# Patient Record
Sex: Male | Born: 1960 | Hispanic: Yes | Marital: Married | Smoking: Never smoker
Health system: Southern US, Community
[De-identification: ages and names within clinical notes are randomized; demographics above are authoritative.]

## PROBLEM LIST (undated history)

## (undated) DIAGNOSIS — K269 Duodenal ulcer, unspecified as acute or chronic, without hemorrhage or perforation: Secondary | ICD-10-CM

## (undated) DIAGNOSIS — E119 Type 2 diabetes mellitus without complications: Secondary | ICD-10-CM

## (undated) DIAGNOSIS — C259 Malignant neoplasm of pancreas, unspecified: Secondary | ICD-10-CM

---

## 2020-04-20 NOTE — Progress Notes (Signed)
Spoke with patient's wife Herb Grays regarding referral for patient to be seen by Dr. Benay Spice.  I gave her the appointment of next Tuesday 04/26/20 at 2 pm.  I asked that they arrive at least 20 minutes prior for registration purposes. She verbalized an understanding.

## 2020-04-26 ENCOUNTER — Inpatient Hospital Stay: Payer: Medicaid - Out of State | Attending: Oncology | Admitting: Oncology

## 2020-04-26 ENCOUNTER — Other Ambulatory Visit: Payer: Self-pay

## 2020-04-26 VITALS — BP 116/89 | HR 87 | Temp 98.3°F | Resp 17 | Ht 70.0 in | Wt 150.4 lb

## 2020-04-26 DIAGNOSIS — C259 Malignant neoplasm of pancreas, unspecified: Secondary | ICD-10-CM

## 2020-04-26 DIAGNOSIS — C251 Malignant neoplasm of body of pancreas: Secondary | ICD-10-CM | POA: Diagnosis not present

## 2020-04-26 NOTE — Progress Notes (Signed)
Called [facility] place referral and was instructed to enter referral into Epic. This was done as requested. Dr. Benay Spice will be attending and patient has agreed to DNR. Will need help with wound care to sacral area and pain medications. Patient is medicaid pending in [place]. Just moved here from [place].

## 2020-04-26 NOTE — Progress Notes (Signed)
Jeremy Horton Patient Consult   Requesting MD: Olin Gurski 60 y.o.  01/20/1961    Reason for Consult: Pancreas cancer   HPI:  Mr.Ostrom was diagnosed with cancer of the pancreas body in May 2020 after he presented with weight loss and abdominal pain.  He was diagnosed and treated in [place] and recently relocated to [place].  The treatment history is obtained from a summary note provided by Dr. Berneice Gandy.  An MRI 07/10/2018 revealed a pancreas body mass with less than 180 of celiac artery involvement with an EUS biopsy confirming a moderately differentiated pancreas carcinoma with focal squamous differentiation, MSS.  He was treated with FOLFIRINOX for 8 cycles.  An MRI on 11/03/2018 revealed tumor progression with encasement of the celiac artery.  Cycle 8 FOLFIRINOX was completed on 11/11/2018.  He started gemcitabine/Abraxane in September 2020 and a repeat CT on 01/28/2019 revealed progression of the pancreas mass with encasement of the celiac artery, less than 180 SMA involvement with a 1.6 cm superior peripancreatic node.  He completed cycle 4 gemcitabine/Abraxane 02/10/2019. He was treated with SBRT in 5 fraction (50 Pearline Cables) in December 2020. He continued gemcitabine/Abraxane with stable to slightly progressive disease.  The CA 19-9 slowly increased in 2021.  CTs 07/29/2019 revealed stable disease.  A CT in October 21 revealed progressive disease and treatment was discontinued.  He is remained off of systemic therapy since October 21.  His wife reports he was admitted in December 21 with with a bleeding gastric ulcer.  He was treated with an endoscopic intervention and antacid therapy.  He saw Dr. Berneice Gandy on 04/12/2020.  Potential salvage systemic therapy options were discussed including 5-FU plus liposomal irinotecan, capecitabine and nilotinib, single agent erlotinib, GTX, and single agent capecitabine.  Hospice care was recommended secondary to his poor  performance status.  Past medical history: 1.  Locally advanced pancreas cancer diagnosed in May 2020 2.  Bleeding duodenal ulcer December 21 3.  Diabetes 2015  Past surgical history: None   Medications: Reviewed  Allergies: Not on File  Family history: No family history of cancer there is a strong family history of diabetes  Social History:   He lives with his wife in [place].  He has worked as a Scientist, forensic.  He is currently disabled.  He does not use cigarettes or alcohol.  No transfusion history.  Jehovah witness no risk factor for HIV or hepatitis.  ROS:   Positives include: Anorexia, 100 pound weight loss since being diagnosed with pancreas cancer, intermittent nausea, abdominal pain, somnolence with a markedly decreased appetite when he was placed on methadone while in [place], sacral ulcer, sleeps during the day and is awake at night, spends most of his time in bed or on the couch, requires assistance for bathing  A complete ROS was otherwise negative.  Physical Exam:  Blood pressure 116/89, pulse 87, temperature 98.3 F (36.8 C), temperature source Tympanic, resp. rate 17, height 5\' 10"  (1.778 m), weight 150 lb 6.4 oz (68.2 kg), SpO2 98 %.  HEENT: Neck without mass Lungs: Clear bilaterally Cardiac: Regular rate and rhythm Abdomen: No mass, no hepatosplenomegaly, no apparent ascites, nontender  Vascular: No leg edema Lymph nodes: No cervical, supraclavicular, axillary, or inguinal nodes Neurologic: Somnolent, arousable, follows commands, moves all extremities, oriented Skin: No rash, 2-3 cm ulcer at the upper left gluteal fold, 1-2 cm ulcer at the right upper gluteal fold Musculoskeletal: No spine tenderness   LAB:  CBC  No results found for: WBC, HGB, HCT, MCV, PLT, NEUTROABS      CMP  No results found for: NA, K, CL, CO2, GLUCOSE, BUN, CREATININE, CALCIUM, PROT, ALBUMIN, AST, ALT, ALKPHOS, BILITOT, GFRNONAA, GFRAA   No results found for:  CEA1  Imaging:  No results found.    Assessment/Plan:   1. Pancreas cancer diagnosed in May 2020  8 cycles of FOLFIRINOX beginning 08/05/2018  MRI abdomen 11/03/2018-tumor progression with mass encasing celiac artery  11/23/2018-started gemcitabine/Abraxane  01/28/2019-CT with progressive disease, 5.6 and meter mass encasing celiac artery, less than 180 SMA involvement, 1.6 cm superior peripancreatic node  Completed 4 cycles of gem/Abraxane 02/10/2019  SBRT, 5 fractions (50 Gy) completed 04/17/2019  Gemcitabine/Abraxane continued with stable to slightly progressive disease  07/29/2019 CT-stable disease with mass encasing the celiac axis  October 2021-CTs with progressive disease, treatment discontinued  2. Admission December 2021 with a bleeding duodenal ulcer treated with bipolar cautery 3. Anorexia/weight loss secondary to #1 4. Pain secondary to #1 5. Sacral decubitus ulcers 6. Jehovah's Witness   Disposition:   Mr Madole has a history of locally advanced pancreas cancer diagnosed in May 2020.  He has been maintained off of systemic therapy since October 2021 secondary to disease progression.  He has a poor performance status, ECOG 2-3.  He is symptomatic with anorexia and pain.  I discussed the poor prognosis with Mr. Bradt and his wife.  He is not a candidate for salvage systemic therapy secondary to his poor performance status.  I recommend hospice care.  He is in agreement.  We discussed CPR and ACLS.  He will be placed on a no CODE BLUE status.  He will decrease the gabapentin to twice daily.  He is unclear whether this is helping the pain and could be contributing to somnolence.  He will continue MS Contin and oxycodone for pain.  Mr. Lisbon will return for an office visit and further discussion in approximately 3 weeks.  He has sacral decubitus ulcers.  We recommended he decrease weightbearing on the sacrum.  We will request a skin care consult from the hospice  team.    Betsy Coder, MD  04/26/2020, 4:41 PM

## 2020-04-27 ENCOUNTER — Telehealth: Payer: Self-pay | Admitting: Oncology

## 2020-04-27 NOTE — Telephone Encounter (Signed)
Scheduled appointment per 2/22 los. Spoke to patient's wife who is aware of appointment date and time.

## 2020-04-28 ENCOUNTER — Observation Stay (HOSPITAL_COMMUNITY)
Admission: EM | Admit: 2020-04-28 | Discharge: 2020-04-29 | Disposition: A | Discharge status: Home / Self Care | Payer: Medicaid - Out of State | Attending: Emergency Medicine | Admitting: Emergency Medicine

## 2020-04-28 ENCOUNTER — Emergency Department (HOSPITAL_COMMUNITY): Payer: Medicaid - Out of State

## 2020-04-28 ENCOUNTER — Other Ambulatory Visit: Payer: Self-pay

## 2020-04-28 ENCOUNTER — Telehealth: Payer: Self-pay | Admitting: *Deleted

## 2020-04-28 ENCOUNTER — Observation Stay (HOSPITAL_COMMUNITY): Payer: Medicaid - Out of State

## 2020-04-28 ENCOUNTER — Encounter (HOSPITAL_COMMUNITY): Payer: Self-pay | Admitting: Emergency Medicine

## 2020-04-28 DIAGNOSIS — Z8507 Personal history of malignant neoplasm of pancreas: Secondary | ICD-10-CM | POA: Diagnosis not present

## 2020-04-28 DIAGNOSIS — Z20822 Contact with and (suspected) exposure to covid-19: Secondary | ICD-10-CM | POA: Diagnosis not present

## 2020-04-28 DIAGNOSIS — E119 Type 2 diabetes mellitus without complications: Secondary | ICD-10-CM | POA: Insufficient documentation

## 2020-04-28 DIAGNOSIS — R4182 Altered mental status, unspecified: Secondary | ICD-10-CM | POA: Diagnosis present

## 2020-04-28 DIAGNOSIS — Z79899 Other long term (current) drug therapy: Secondary | ICD-10-CM | POA: Diagnosis not present

## 2020-04-28 DIAGNOSIS — G9341 Metabolic encephalopathy: Principal | ICD-10-CM | POA: Insufficient documentation

## 2020-04-28 DIAGNOSIS — C251 Malignant neoplasm of body of pancreas: Secondary | ICD-10-CM | POA: Diagnosis not present

## 2020-04-28 HISTORY — DX: Malignant neoplasm of pancreas, unspecified: C25.9

## 2020-04-28 HISTORY — DX: Type 2 diabetes mellitus without complications: E11.9

## 2020-04-28 HISTORY — DX: Duodenal ulcer, unspecified as acute or chronic, without hemorrhage or perforation: K26.9

## 2020-04-28 LAB — COMPREHENSIVE METABOLIC PANEL
ALT: 27 U/L (ref 0–44)
AST: 24 U/L (ref 15–41)
Albumin: 3 g/dL — ABNORMAL LOW (ref 3.5–5.0)
Alkaline Phosphatase: 101 U/L (ref 38–126)
Anion gap: 10 (ref 5–15)
BUN: 14 mg/dL (ref 6–20)
CO2: 27 mmol/L (ref 22–32)
Calcium: 9.8 mg/dL (ref 8.9–10.3)
Chloride: 96 mmol/L — ABNORMAL LOW (ref 98–111)
Creatinine, Ser: 0.66 mg/dL (ref 0.61–1.24)
GFR, Estimated: >60 mL/min (ref >60)
Glucose, Bld: 409 mg/dL — ABNORMAL HIGH (ref 70–99)
Potassium: 4 mmol/L (ref 3.5–5.1)
Sodium: 133 mmol/L — ABNORMAL LOW (ref 135–145)
Total Bilirubin: 0.8 mg/dL (ref 0.3–1.2)
Total Protein: 7.1 g/dL (ref 6.5–8.1)

## 2020-04-28 LAB — CBC WITH DIFFERENTIAL/PLATELET
Abs Immature Granulocytes: 0.02 10*3/uL (ref 0.00–0.07)
Basophils Absolute: 0.1 10*3/uL (ref 0.0–0.1)
Basophils Relative: 1 %
Eosinophils Absolute: 0.1 10*3/uL (ref 0.0–0.5)
Eosinophils Relative: 2 %
HCT: 33.7 % — ABNORMAL LOW (ref 39.0–52.0)
Hemoglobin: 10.8 g/dL — ABNORMAL LOW (ref 13.0–17.0)
Immature Granulocytes: 0 %
Lymphocytes Relative: 15 %
Lymphs Abs: 1.1 10*3/uL (ref 0.7–4.0)
MCH: 28 pg (ref 26.0–34.0)
MCHC: 32 g/dL (ref 30.0–36.0)
MCV: 87.3 fL (ref 80.0–100.0)
Monocytes Absolute: 0.6 10*3/uL (ref 0.1–1.0)
Monocytes Relative: 8 %
Neutro Abs: 5.2 10*3/uL (ref 1.7–7.7)
Neutrophils Relative %: 74 %
Platelets: 226 10*3/uL (ref 150–400)
RBC: 3.86 MIL/uL — ABNORMAL LOW (ref 4.22–5.81)
RDW: 14.6 % (ref 11.5–15.5)
WBC: 7.1 10*3/uL (ref 4.0–10.5)
nRBC: 0 % (ref 0.0–0.2)

## 2020-04-28 LAB — LACTIC ACID, PLASMA: Lactic Acid, Venous: 1.4 mmol/L (ref 0.5–1.9)

## 2020-04-28 LAB — URINALYSIS, COMPLETE (UACMP) WITH MICROSCOPIC
Bacteria, UA: NONE SEEN
Bilirubin Urine: NEGATIVE
Glucose, UA: >=500 mg/dL — AB
Hgb urine dipstick: NEGATIVE
Ketones, ur: 20 mg/dL — AB
Leukocytes,Ua: NEGATIVE
Nitrite: NEGATIVE
Protein, ur: NEGATIVE mg/dL
Specific Gravity, Urine: 1.031 — ABNORMAL HIGH (ref 1.005–1.030)
pH: 7 (ref 5.0–8.0)

## 2020-04-28 LAB — CBG MONITORING, ED: Glucose-Capillary: 426 mg/dL — ABNORMAL HIGH (ref 70–99)

## 2020-04-28 LAB — RESP PANEL BY RT-PCR (FLU A&B, COVID) ARPGX2
Influenza A by PCR: NEGATIVE
Influenza B by PCR: NEGATIVE
SARS Coronavirus 2 by RT PCR: NEGATIVE

## 2020-04-28 LAB — AMMONIA: Ammonia: 54 umol/L — ABNORMAL HIGH (ref 9–35)

## 2020-04-28 LAB — GLUCOSE, CAPILLARY
Glucose-Capillary: 323 mg/dL — ABNORMAL HIGH (ref 70–99)
Glucose-Capillary: 337 mg/dL — ABNORMAL HIGH (ref 70–99)

## 2020-04-28 MED ORDER — LORAZEPAM 2 MG/ML IJ SOLN
1.0000 mg | Freq: Once | INTRAMUSCULAR | Status: DC
  Filled 2020-04-28 (×2): qty 1

## 2020-04-28 MED ORDER — SODIUM CHLORIDE 0.9 % IV SOLN: INTRAVENOUS | Status: DC

## 2020-04-28 MED ORDER — HYDROMORPHONE HCL 1 MG/ML IJ SOLN
0.5000 mg | INTRAMUSCULAR | Status: DC | PRN
Start: 2020-04-28 — End: 2020-04-30
  Administered 2020-04-28: 1 mg via INTRAVENOUS
  Filled 2020-04-28: qty 1

## 2020-04-28 MED ORDER — OXYCODONE HCL 5 MG PO TABS
20.0000 mg | ORAL_TABLET | ORAL | Status: DC | PRN
  Administered 2020-04-28 – 2020-04-29 (×5): 20 mg via ORAL
  Filled 2020-04-28 (×6): qty 4

## 2020-04-28 MED ORDER — ONDANSETRON HCL 4 MG/2ML IJ SOLN: 4.0000 mg | Freq: Four times a day (QID) | INTRAMUSCULAR | Status: DC | PRN

## 2020-04-28 MED ORDER — THIAMINE HCL 100 MG/ML IJ SOLN
100.0000 mg | Freq: Once | INTRAMUSCULAR | Status: AC
  Administered 2020-04-28: 100 mg via INTRAVENOUS
  Filled 2020-04-28: qty 2

## 2020-04-28 MED ORDER — INSULIN ASPART 100 UNIT/ML ~~LOC~~ SOLN
0.0000 [IU] | Freq: Three times a day (TID) | SUBCUTANEOUS | Status: DC
  Administered 2020-04-28 – 2020-04-29 (×2): 7 [IU] via SUBCUTANEOUS
  Administered 2020-04-29: 5 [IU] via SUBCUTANEOUS
  Administered 2020-04-29: 3 [IU] via SUBCUTANEOUS

## 2020-04-28 MED ORDER — ENOXAPARIN SODIUM 40 MG/0.4ML ~~LOC~~ SOLN
40.0000 mg | SUBCUTANEOUS | Status: DC
  Administered 2020-04-28: 40 mg via SUBCUTANEOUS
  Filled 2020-04-28: qty 0.4

## 2020-04-28 MED ORDER — PANTOPRAZOLE SODIUM 40 MG PO TBEC
40.0000 mg | DELAYED_RELEASE_TABLET | Freq: Two times a day (BID) | ORAL | Status: DC
  Administered 2020-04-28 – 2020-04-29 (×2): 40 mg via ORAL
  Filled 2020-04-28 (×2): qty 1

## 2020-04-28 MED ORDER — ALBUTEROL SULFATE (2.5 MG/3ML) 0.083% IN NEBU: 2.5000 mg | INHALATION_SOLUTION | RESPIRATORY_TRACT | Status: DC | PRN

## 2020-04-28 MED ORDER — ONDANSETRON HCL 4 MG PO TABS: 4.0000 mg | ORAL_TABLET | Freq: Four times a day (QID) | ORAL | Status: DC | PRN

## 2020-04-28 MED ORDER — DOCUSATE SODIUM 100 MG PO CAPS
200.0000 mg | ORAL_CAPSULE | Freq: Two times a day (BID) | ORAL | Status: DC
  Administered 2020-04-28 – 2020-04-29 (×2): 200 mg via ORAL
  Filled 2020-04-28 (×2): qty 2

## 2020-04-28 MED ORDER — INSULIN ASPART 100 UNIT/ML ~~LOC~~ SOLN
0.0000 [IU] | Freq: Every day | SUBCUTANEOUS | Status: DC
  Administered 2020-04-28: 4 [IU] via SUBCUTANEOUS

## 2020-04-28 MED ORDER — TRAZODONE HCL 50 MG PO TABS
25.0000 mg | ORAL_TABLET | Freq: Every evening | ORAL | Status: DC | PRN
  Administered 2020-04-28: 25 mg via ORAL
  Filled 2020-04-28: qty 1

## 2020-04-28 NOTE — Telephone Encounter (Signed)
Wife left VM asking to speak with RN about her husband's status. They are considering taking him to the emergency room. Called wife back at 1600: Noted he is in the ER--CT head and CXR normal. Ammonia level returned at 54 and glucose elevated at 426. Reports he has been confused. She has been giving him Boost to drink since he has not been eating. Suggested she try Glucerna instead. Will make MD aware he is in the ER and will f/u tomorrow.

## 2020-04-28 NOTE — ED Triage Notes (Signed)
BIB wife, states confusion and AMS x2 days, patient is delayed in answering questions. Denies urinary or pain changes, or recent falls. Cancer doctor recommended hospice care. Also endorses decreased appetite.

## 2020-04-28 NOTE — H&P (Signed)
History and Physical  [name] ENI:[id number] DOB: 1961-02-03 DOA: 04/28/2020   PCP: Ladell Pier, MD  Patient coming from: Home & is able to ambulate  Chief Complaint: AMS  HPI: Jeremy Horton is a 60 y.o. male with medical history significant for inoperable pancreatic cancer diagnosed in May 2020, diabetes, history of bleeding duodenal ulcer in December 2021, presents to the ED due to altered mental status.  Wife at bedside, history mostly obtained from her.  Wife reported that patient had been intermittently confused for the past few days, worsening poor p.o. intake, drinks only Ensure, poor sleep, with progressive weight loss.  Wife also noted that patient has been more slow to respond to questions and has difficulty getting words out, and sometimes would go off tangent and not make sense.  Reports that is a change from his normal baseline.  Of note, patient's pain medication has been recently changed from OxyContin to long-acting morphine on February 11, wondering if this may be contributing to his progressive symptoms.  At the time of my examination, patient was alert, awake, oriented to self, date of birth and not place.  Patient denied any cough, runny nose, sore throat, chest pain, fever/chills, shortness of breath, nausea/vomiting, diarrhea, headaches.  Reports some remote history of falls, but none currently.  Patient does report chronic generalized abdominal pain likely from pancreatic cancer.  Patient has been fully vaccinated including a booster shot in late 2021.  Of note, patient and wife moved from [place] to [place] to be close to their daughter and for much quiet life.  Patient follows with Dr. Benay Spice and just had an appointment on 04/26/2020.  Patient and wife requesting hospice services.  Patient admitted for further management for acute metabolic encephalopathy as well as failure to thrive.    ED Course:  Vital signs stable, labs with sodium of 133, glucose of 409,  but not in DKA, hemoglobin 10.8, ammonia 54, chest x-ray unremarkable, CT head unremarkable for any acute intracranial changes, BCx2/UA/UC pending, Covid negative.  Review of Systems: Review of systems are otherwise negative   Past Medical History:  Diagnosis Date   Diabetes mellitus without complication ([place])    Duodenal ulcer    Pancreatic cancer ([place])    History reviewed. No pertinent surgical history.  Social History:  reports that he has never smoked. He has never used smokeless tobacco. He reports previous alcohol use. He reports previous drug use.   Allergies  Allergen Reactions   Ibuprofen Other (See Comments)    Can't take due to stomach ulcer   Tylenol [Acetaminophen] Other (See Comments)    Can't take due to stomach ulcer    History reviewed. No pertinent family history.    Prior to Admission medications   Medication Sig Start Date End Date Taking? Authorizing Provider  docusate sodium (COLACE) 100 MG capsule Take 200 mg by mouth 2 (two) times daily.   Yes [provider]  gabapentin (NEURONTIN) 600 MG tablet Take 600 mg by mouth 3 (three) times daily. 01/24/20  Yes [provider]  morphine (MS CONTIN) 30 MG 12 hr tablet Take 30 mg by mouth every 12 (twelve) hours as needed for pain. 04/15/20 05/15/20 Yes [provider]  ondansetron (ZOFRAN) 8 MG tablet Take 8 mg by mouth every 8 (eight) hours as needed for nausea or vomiting.   Yes [provider]  Oxycodone HCl 20 MG TABS Take 20 mg by mouth every 4 (four) hours as needed (pain). 02/19/20  Yes [provider]  pantoprazole (PROTONIX) 40 MG tablet Take 40 mg by mouth 2 (two) times daily.   Yes [provider]    Physical Exam: BP 119/83    Pulse 81    Temp 99.1 F (37.3 C) (Rectal)    Resp 15    SpO2 97%    General: NAD, alert, awake, oriented x3, cachectic, frail, noted temporal wasting  Eyes: Normal  ENT: Normal  Neck: Supple  Cardiovascular:  S1, S2 present  Respiratory: CTA B  Abdomen: Soft, mild generalized tenderness on palpation, nondistended, bowel sounds present  Skin: Normal  Musculoskeletal: No bilateral pedal edema noted  Psychiatric: Normal mood  Neurologic: No obvious focal neurologic deficits noted, strength equal in all extremities          Labs on Admission:  Basic Metabolic Panel: Recent Labs  Lab 04/28/20 1300  NA 133*  K 4.0  CL 96*  CO2 27  GLUCOSE 409*  BUN 14  CREATININE 0.66  CALCIUM 9.8   Liver Function Tests: Recent Labs  Lab 04/28/20 1300  AST 24  ALT 27  ALKPHOS 101  BILITOT 0.8  PROT 7.1  ALBUMIN 3.0*   No results for input(s): LIPASE, AMYLASE in the last 168 hours. Recent Labs  Lab 04/28/20 1300  AMMONIA 54*   CBC: Recent Labs  Lab 04/28/20 1300  WBC 7.1  NEUTROABS 5.2  HGB 10.8*  HCT 33.7*  MCV 87.3  PLT 226   Cardiac Enzymes: No results for input(s): CKTOTAL, CKMB, CKMBINDEX, TROPONINI in the last 168 hours.  BNP (last 3 results) No results for input(s): BNP in the last 8760 hours.  ProBNP (last 3 results) No results for input(s): PROBNP in the last 8760 hours.  CBG: Recent Labs  Lab 04/28/20 1259  GLUCAP 426*    Radiological Exams on Admission: CT HEAD WO CONTRAST  Result Date: 04/28/2020 CLINICAL DATA:  Confusion/altered mental status for 2 days. EXAM: CT HEAD WITHOUT CONTRAST TECHNIQUE: Contiguous axial images were obtained from the base of the skull through the vertex without intravenous contrast. COMPARISON:  None. FINDINGS: Brain: There is no evidence of an acute infarct, intracranial hemorrhage, mass, midline shift, or extra-axial fluid collection. The ventricles and sulci are normal. Vascular: Calcified atherosclerosis at the skull base. No hyperdense vessel. Skull: No fracture or suspicious osseous lesion. Sinuses/Orbits: Unremarkable orbits. Paranasal sinuses and mastoid air cells are clear. Other: None. IMPRESSION: Unremarkable CT  appearance of the brain. Electronically Signed   By: Logan Bores M.D.   On: 04/28/2020 15:00   DG Chest Port 1 View  Result Date: 04/28/2020 CLINICAL DATA:  Altered mental status EXAM: PORTABLE CHEST 1 VIEW COMPARISON:  None. FINDINGS: Right chest wall port catheter overlies SVC. Low lung volumes. No consolidation or edema. No pleural effusion. Heart size is normal. IMPRESSION: Low lung volumes. Electronically Signed   By: Macy Mis M.D.   On: 04/28/2020 14:37    EKG: Independently reviewed.  Normal sinus rhythm  Assessment/Plan Present on Admission:  Acute metabolic encephalopathy  Cancer of pancreas, body ([place])  Principal Problem:   Acute metabolic encephalopathy Active Problems:   Cancer of pancreas, body ([place])   Diabetes mellitus without complication ([place])   Acute metabolic encephalopathy Likely multifactorial, medication induced, failure to thrive/poor oral intake, progression of malignancy, rule out infection Currently afebrile, no leukocytosis Ammonia 54 BCx2/UA/UC pending Covid negative Chest x-ray unremarkable CT head unremarkable for any acute intracranial changes MRI brain pending IV fluids, supportive care  Hyponatremia Likely 2/2 poor oral intake IV fluids  Diabetes mellitus type 2, uncontrolled with hyperglycemia A1c pending SSI, Accu-Cheks, hypoglycemic protocol  Normocytic anemia Likely multifactorial, malignancy, poor oral intake Daily CBC  Locally advanced pancreatic cancer Diagnosed in May 2020, completed multiple rounds of chemo with progression of disease Follows with Dr. Benay Spice, last seen on 04/26/2020, informed via epic of admission Pain management  History of bleeding duodenal ulcer in December 2021 Continue PPI  Anorexia/weight loss/failure to thrive Likely 2/2 advanced pancreatic cancer Dietitian consult, PT consult  Goals of care discussion Patient with very poor prognosis, with poor performance status DNR Palliative care  consulted for further goals of discussion as family requesting information about hospice      DVT prophylaxis: Lovenox  Code Status: DNR  Family Communication: Discussed with wife at bedside  Disposition Plan: Likely home  Consults called: Palliative, oncology  Admission status: Observation    Alma Friendly MD [facility]   If 7PM-7AM, please contact night-coverage [email or web address]  04/28/2020, 4:29 PM

## 2020-04-28 NOTE — ED Provider Notes (Signed)
[facility] [facility] DEPT Provider Note   CSN: [id number] Arrival date & time: 04/28/20  1209     History Chief Complaint  Patient presents with  . Altered Mental Status    Jeremy Horton is a 60 y.o. male with a past medical history of diabetes, and inoperable pancreatic cancer who presents to the emergency department for altered mental status.  History is given by the patient's wife.  She states that for the past 2 to 3 days he has been very confused, not sleeping at night, he has had very poor p.o. intake.  She does mention that he changed from OxyContin to long-acting morphine on February 11 and was wondering if this may have contributed to his change in mental status.  She states that seems to have a slowed response and difficulty getting out his words.  At times he will latch onto one word that she said and then confabulate a story or go off in a completely different direction that does not make any sense.  She states that this is not his baseline mental status.  She says he has had significant weight loss and decline due to the cancer and is quite weak and has poor appetite at baseline.  He has not been having any complaints of urinary symptoms.  He has not been complaining of severe pain, nausea, vomiting, headache.  He has had 2 COVID vaccines and a booster shot in late 2021.  HPI     Past Medical History:  Diagnosis Date  . Diabetes mellitus without complication ([place])   . Duodenal ulcer   . Pancreatic cancer [facility])     Patient Active Problem List   Diagnosis Date Noted  . Cancer of pancreas, body ([place]) 04/26/2020    History reviewed. No pertinent surgical history.     No family history on file.  Social History   Tobacco Use  . Smoking status: Never Smoker  . Smokeless tobacco: Never Used  Substance Use Topics  . Alcohol use: Not Currently  . Drug use: Not Currently    Home Medications Prior to Admission medications   Medication Sig Start  Date End Date Taking? Authorizing Provider  docusate sodium (COLACE) 100 MG capsule Take 200 mg by mouth 2 (two) times daily.   Yes [provider]  gabapentin (NEURONTIN) 600 MG tablet Take 600 mg by mouth 3 (three) times daily. 01/24/20  Yes [provider]  morphine (MS CONTIN) 30 MG 12 hr tablet Take 30 mg by mouth every 12 (twelve) hours as needed for pain. 04/15/20 05/15/20 Yes [provider]  ondansetron (ZOFRAN) 8 MG tablet Take 8 mg by mouth every 8 (eight) hours as needed for nausea or vomiting.   Yes [provider]  Oxycodone HCl 20 MG TABS Take 20 mg by mouth every 4 (four) hours as needed (pain). 02/19/20  Yes [provider]  pantoprazole (PROTONIX) 40 MG tablet Take 40 mg by mouth 2 (two) times daily.   Yes [provider]    Allergies    Ibuprofen and Tylenol [acetaminophen]  Review of Systems   Review of Systems  Unable to perform ROS: Mental status change      .Physical Exam Updated Vital Signs BP 136/90   Pulse 80   Temp 99.1 F (37.3 C) (Oral)   Resp 16   SpO2 98%   Physical Exam Vitals and nursing note reviewed.  Constitutional:      General: He is not in acute distress.  Appearance: He is well-developed and well-nourished. He is not diaphoretic.     Comments: Patient appears cachectic, obvious temporal wasting.  HENT:     Head: Normocephalic and atraumatic.  Eyes:     General: No scleral icterus.    Conjunctiva/sclera: Conjunctivae normal.  Cardiovascular:     Rate and Rhythm: Normal rate and regular rhythm.     Heart sounds: Normal heart sounds.  Pulmonary:     Effort: Pulmonary effort is normal. No respiratory distress.     Breath sounds: Normal breath sounds.  Abdominal:     Palpations: Abdomen is soft.     Tenderness: There is no abdominal tenderness.  Musculoskeletal:        General: No edema.     Cervical back: Normal range of motion and neck supple.  Skin:    General: Skin is warm  and dry.  Neurological:     Mental Status: He is alert. He is disoriented and confused.     GCS: GCS eye subscore is 4. GCS verbal subscore is 5. GCS motor subscore is 6.     Cranial Nerves: Cranial nerves are intact. No dysarthria or facial asymmetry.     Sensory: Sensation is intact.     Motor: Motor function is intact. No weakness or pronator drift.     Deep Tendon Reflexes:     Reflex Scores:      Patellar reflexes are 2+ on the right side and 2+ on the left side.    Comments: Patient having difficulty following two-step commands.  Unable to follow directions for coordination tests.  Gait deferred.  Psychiatric:        Behavior: Behavior normal.     ED Results / Procedures / Treatments   Labs (all labs ordered are listed, but only abnormal results are displayed) Labs Reviewed  COMPREHENSIVE METABOLIC PANEL - Abnormal; Notable for the following components:      Result Value   Sodium 133 (*)    Chloride 96 (*)    Glucose, Bld 409 (*)    Albumin 3.0 (*)    All other components within normal limits  CBC WITH DIFFERENTIAL/PLATELET - Abnormal; Notable for the following components:   RBC 3.86 (*)    Hemoglobin 10.8 (*)    HCT 33.7 (*)    All other components within normal limits  AMMONIA - Abnormal; Notable for the following components:   Ammonia 54 (*)    All other components within normal limits  CBG MONITORING, ED - Abnormal; Notable for the following components:   Glucose-Capillary 426 (*)    All other components within normal limits  RESP PANEL BY RT-PCR (FLU A&B, COVID) ARPGX2  CULTURE, BLOOD (ROUTINE X 2)  CULTURE, BLOOD (ROUTINE X 2)  LACTIC ACID, PLASMA  URINALYSIS, COMPLETE (UACMP) WITH MICROSCOPIC  URINALYSIS, ROUTINE W REFLEX MICROSCOPIC    EKG EKG Interpretation  Date/Time:  Thursday April 28 2020 13:06:10 EST Ventricular Rate:  81 PR Interval:    QRS Duration: 96 QT Interval:  393 QTC Calculation: 457 R Axis:   68 Text Interpretation: Sinus rhythm  Abnormal R-wave progression, early transition No previous tracing Confirmed by Blanchie Dessert [phone]) on 04/28/2020 3:02:20 PM   Radiology CT HEAD WO CONTRAST  Result Date: 04/28/2020 CLINICAL DATA:  Confusion/altered mental status for 2 days. EXAM: CT HEAD WITHOUT CONTRAST TECHNIQUE: Contiguous axial images were obtained from the base of the skull through the vertex without intravenous contrast. COMPARISON:  None. FINDINGS: Brain: There is no evidence  of an acute infarct, intracranial hemorrhage, mass, midline shift, or extra-axial fluid collection. The ventricles and sulci are normal. Vascular: Calcified atherosclerosis at the skull base. No hyperdense vessel. Skull: No fracture or suspicious osseous lesion. Sinuses/Orbits: Unremarkable orbits. Paranasal sinuses and mastoid air cells are clear. Other: None. IMPRESSION: Unremarkable CT appearance of the brain. Electronically Signed   By: Logan Bores M.D.   On: 04/28/2020 15:00   DG Chest Port 1 View  Result Date: 04/28/2020 CLINICAL DATA:  Altered mental status EXAM: PORTABLE CHEST 1 VIEW COMPARISON:  None. FINDINGS: Right chest wall port catheter overlies SVC. Low lung volumes. No consolidation or edema. No pleural effusion. Heart size is normal. IMPRESSION: Low lung volumes. Electronically Signed   By: Macy Mis M.D.   On: 04/28/2020 14:37    Procedures Procedures   Medications Ordered in ED Medications  thiamine (B-1) injection 100 mg (100 mg Intravenous Given 04/28/20 1314)    ED Course  I have reviewed the triage vital signs and the nursing notes.  Pertinent labs & imaging results that were available during my care of the patient were reviewed by me and considered in my medical decision making (see chart for details).    MDM Rules/Calculators/A&P                          [id number] mental status VS: BP 136/90   Pulse 80   Temp 99.1 F (37.3 C) (Oral)   Resp 16   SpO2 98%   SN:[id number] is gathered by wife and emr.  Previous records obtained and reviewed. DDX:The patient's complaint of ams involves an extensive number of diagnostic and treatment options, and is a complaint that carries with it a high risk of complications, morbidity, and potential mortality. Given the large differential diagnosis, medical decision making is of high complexity. The differential diagnosis for AMS is extensive and includes, but is not limited to: drug overdose - opioids, alcohol, sedatives, antipsychotics, drug withdrawal, others; Metabolic: hypoxia, hypoglycemia, hyperglycemia, hypercalcemia, hypernatremia, hyponatremia, uremia, hepatic encephalopathy, hypothyroidism, hyperthyroidism, vitamin B12 or thiamine deficiency, carbon monoxide poisoning, Lactic acidosis, DKA/HHOS; Infectious: meningitis, encephalitis, bacteremia/sepsis, urinary tract infection, pneumonia, neurosyphilis; Structural: Space-occupying lesion, (brain tumor, subdural hematoma, hydrocephalus,); Vascular: stroke, subarachnoid hemorrhage, coronary ischemia, hypertensive encephalopathy, CNS vasculitis, thrombotic thrombocytopenic purpura, disseminated intravascular coagulation, hyperviscosity; Psychiatric: Schizophrenia, depression; Other: Seizure, hypothermia, heat stroke, ICU psychosis, dementia -"sundowning."  Labs: I ordered reviewed and interpreted labs which include CBC which shows a normal white blood cell count, hemoglobin of 10.8.  CBG is 426, ammonia level 54 not significantly elevated.  Respiratory panel is negative for Covid or influenza, lactic acid within normal limits.  Urine is pending Imaging: I ordered and reviewed images which included CT head without contrast and a one view chest x-ray. I independently visualized and interpreted all imaging. There are no acute, significant findings on today's images. EKG: Normal sinus rhythm at a rate of 81 Consults: MDM: Patient here with altered mental status of unknown etiology.  He was given thiamine as his intake  has been very poor recently.  The patient is obviously cachectic.  He does not have any obvious signs of head trauma or bleeding.  His neurological exam although presenting with confusion and having difficulty following commands he has no obvious deficits on that exam.  His ammonia level is only mildly elevated.  Blood sugar is significantly elevated and the patient's wife states that he is normally low.  I think the patient  should come in today for his change in mental status.  His wife is requesting consult with hospice care Patient disposition:The patient appears reasonably stabilized for admission considering the current resources, flow, and capabilities available in the ED at this time, and I doubt any other [facility] requiring further screening and/or treatment in the ED prior to admission.        Final Clinical Impression(s) / ED Diagnoses Final diagnoses:  Altered mental status, unspecified altered mental status type    Rx / DC Orders ED Discharge Orders    None       Margarita Mail, PA-C 04/28/20 1518    Tegeler, Gwenyth Allegra, MD 04/28/20 [phone]

## 2020-04-29 DIAGNOSIS — C251 Malignant neoplasm of body of pancreas: Secondary | ICD-10-CM | POA: Diagnosis not present

## 2020-04-29 DIAGNOSIS — G9341 Metabolic encephalopathy: Secondary | ICD-10-CM | POA: Diagnosis not present

## 2020-04-29 DIAGNOSIS — E119 Type 2 diabetes mellitus without complications: Secondary | ICD-10-CM | POA: Diagnosis not present

## 2020-04-29 LAB — HEMOGLOBIN A1C
Hgb A1c MFr Bld: 10.3 % — ABNORMAL HIGH (ref 4.8–5.6)
Mean Plasma Glucose: 248.91 mg/dL

## 2020-04-29 LAB — CBC
HCT: 31.9 % — ABNORMAL LOW (ref 39.0–52.0)
Hemoglobin: 10.6 g/dL — ABNORMAL LOW (ref 13.0–17.0)
MCH: 28.7 pg (ref 26.0–34.0)
MCHC: 33.2 g/dL (ref 30.0–36.0)
MCV: 86.4 fL (ref 80.0–100.0)
Platelets: 207 10*3/uL (ref 150–400)
RBC: 3.69 MIL/uL — ABNORMAL LOW (ref 4.22–5.81)
RDW: 14.6 % (ref 11.5–15.5)
WBC: 7 10*3/uL (ref 4.0–10.5)
nRBC: 0 % (ref 0.0–0.2)

## 2020-04-29 LAB — GLUCOSE, CAPILLARY
Glucose-Capillary: 209 mg/dL — ABNORMAL HIGH (ref 70–99)
Glucose-Capillary: 251 mg/dL — ABNORMAL HIGH (ref 70–99)
Glucose-Capillary: 337 mg/dL — ABNORMAL HIGH (ref 70–99)

## 2020-04-29 LAB — BASIC METABOLIC PANEL
Anion gap: 8 (ref 5–15)
BUN: 10 mg/dL (ref 6–20)
CO2: 27 mmol/L (ref 22–32)
Calcium: 9.4 mg/dL (ref 8.9–10.3)
Chloride: 99 mmol/L (ref 98–111)
Creatinine, Ser: 0.47 mg/dL — ABNORMAL LOW (ref 0.61–1.24)
GFR, Estimated: >60 mL/min (ref >60)
Glucose, Bld: 219 mg/dL — ABNORMAL HIGH (ref 70–99)
Potassium: 3.7 mmol/L (ref 3.5–5.1)
Sodium: 134 mmol/L — ABNORMAL LOW (ref 135–145)

## 2020-04-29 LAB — HIV ANTIBODY (ROUTINE TESTING W REFLEX): HIV Screen 4th Generation wRfx: NONREACTIVE

## 2020-04-29 LAB — AMMONIA: Ammonia: 58 umol/L — ABNORMAL HIGH (ref 9–35)

## 2020-04-29 MED ORDER — LIVING WELL WITH DIABETES BOOK
Freq: Once | Status: AC
  Filled 2020-04-29: qty 1

## 2020-04-29 MED ORDER — HEPARIN SOD (PORK) LOCK FLUSH 100 UNIT/ML IV SOLN
500.0000 [IU] | Freq: Once | INTRAVENOUS | Status: AC
  Administered 2020-04-29: 500 [IU] via INTRAVENOUS
  Filled 2020-04-29: qty 5

## 2020-04-29 MED ORDER — BLOOD GLUCOSE MONITOR KIT: PACK | 0 refills | Status: AC

## 2020-04-29 MED ORDER — GLUCERNA SHAKE PO LIQD
237.0000 mL | Freq: Three times a day (TID) | ORAL | Status: DC
  Filled 2020-04-29: qty 237

## 2020-04-29 MED ORDER — DIPHENHYDRAMINE HCL 25 MG PO CAPS
25.0000 mg | ORAL_CAPSULE | Freq: Once | ORAL | Status: AC
  Administered 2020-04-29: 25 mg via ORAL
  Filled 2020-04-29: qty 1

## 2020-04-29 MED ORDER — "PEN NEEDLES 3/16"" 31G X 5 MM MISC": 0 refills | Status: AC

## 2020-04-29 MED ORDER — NOVOLOG FLEXPEN 100 UNIT/ML ~~LOC~~ SOPN: 0.0000 [IU] | PEN_INJECTOR | Freq: Three times a day (TID) | SUBCUTANEOUS | 0 refills | Status: AC

## 2020-04-29 MED ORDER — CHLORHEXIDINE GLUCONATE CLOTH 2 % EX PADS
6.0000 | MEDICATED_PAD | Freq: Every day | CUTANEOUS | Status: DC
  Administered 2020-04-29: 6 via TOPICAL

## 2020-04-29 MED ORDER — LANTUS SOLOSTAR 100 UNIT/ML ~~LOC~~ SOPN: 5.0000 [IU] | PEN_INJECTOR | Freq: Every day | SUBCUTANEOUS | 0 refills | Status: DC

## 2020-04-29 MED ORDER — ADULT MULTIVITAMIN W/MINERALS CH: 1.0000 | ORAL_TABLET | Freq: Every day | ORAL | Status: DC

## 2020-04-29 NOTE — Progress Notes (Signed)
Inpatient Diabetes Program Recommendations  AACE/ADA: New Consensus Statement on Inpatient Glycemic Control (2015)  Target Ranges:  Prepandial:   less than 140 mg/dL      Peak postprandial:   less than 180 mg/dL (1-2 hours)      Critically ill patients:  140 - 180 mg/dL   Lab Results  Component Value Date   GLUCAP 209 (H) 04/29/2020   HGBA1C 10.3 (H) 04/29/2020    Review of Glycemic Control  Dx: pancreatic ca and DM  Diabetes history: DM2, previously on meds Outpatient Diabetes medications: None Current orders for Inpatient glycemic control: Novolog 0-9 units TID with meals and 0-5 HS.  Glucose 219 CBG 209 HgbA1C - 10.3%  May benefit from small amount of basal insulin.  Inpatient Diabetes Program Recommendations:     Consider addition of Lantus 6 units Q24H May need meal coverage if post-prandials > 180 mg/dL.  Will speak with pt/wife today regarding his blood sugar control and possible need to go home on insulin.  Continue to follow.  Thank you. Lorenda Peck, RD, LDN, CDE Inpatient Diabetes Coordinator [phone]

## 2020-04-29 NOTE — Progress Notes (Signed)
Inpatient Diabetes Program Recommendations  AACE/ADA: New Consensus Statement on Inpatient Glycemic Control (2015)  Target Ranges:  Prepandial:   less than 140 mg/dL      Peak postprandial:   less than 180 mg/dL (1-2 hours)      Critically ill patients:  140 - 180 mg/dL   Lab Results  Component Value Date   GLUCAP 251 (H) 04/29/2020   HGBA1C 10.3 (H) 04/29/2020    Review of Glycemic Control  Diabetes history: DM2 Outpatient Diabetes medications: None Current orders for Inpatient glycemic control: Novolog 0-9 units TID with meals and 0-5 HS  HgbA1C - 10.3%  Discussed hypoglycemia s/s and treatment. Educated patient and spouse on insulin pen use at home.  Reviewed all steps if insulin pen including attachment of needle, 2-unit air shot, dialing up dose, giving injection, removing needle, disposal of sharps, storage of unused insulin, disposal of insulin etc. Patient able to provide successful return demonstration. Also reviewed troubleshooting with insulin pen. MD to give patient Rxs for insulin pens and insulin pen needles.  Inpatient Diabetes Program Recommendations:  (For discharge)  Lantus 5 units QHS Novolog 0-9 units TID with meals  Will need Blood glucose meter kit and insulin pen needles.  Pt willing to go home on both basal and bolus insulin and will check blood sugars at least 3x/day. Will call Hospice team if blood sugars < 70 or > 200 mg/dL on a regular basis.   Thank you. Rhonda Vaughan, RD, LDN, CDE Inpatient Diabetes Coordinator [phone]    

## 2020-04-29 NOTE — Discharge Summary (Signed)
Discharge Summary  Jeremy Horton HYQ:[id number] DOB: [birth year]  PCP: Ladell Pier, MD  Admit date: 04/28/2020 Discharge date: 04/29/2020  Time spent: 30 mins  Recommendations for Outpatient Follow-up:  1. Follow-up with home hospice 2. Follow-up with oncology as scheduled  Discharge Diagnoses:  Active Hospital Problems   Diagnosis Date Noted  . Acute metabolic encephalopathy 95/28/4132  . Diabetes mellitus without complication ([place])   . Cancer of pancreas, body ([place]) 04/26/2020    Resolved Hospital Problems  No resolved problems to display.    Discharge Condition: Stable  Diet recommendation: Moderate carb  Vitals:   04/29/20 0004 04/29/20 0515  BP: (!) 135/95 137/89  Pulse: 91 80  Resp: 16   Temp: 98.3 F (36.8 C) 98.7 F (37.1 C)  SpO2: 98% 100%    History of present illness:  Jeremy Horton is a 60 y.o. male with medical history significant for inoperable pancreatic cancer diagnosed in May 2020, diabetes, history of bleeding duodenal ulcer in December 2021, presents to the ED due to altered mental status.  Wife at bedside, history mostly obtained from her.  Wife reported that patient had been intermittently confused for the past few days, worsening poor p.o. intake, drinks only Ensure, poor sleep, with progressive weight loss.  Wife also noted that patient has been more slow to respond to questions and has difficulty getting words out, and sometimes would go off tangent and not make sense.  Reports that is a change from his normal baseline.  Of note, patient's pain medication has been recently changed from OxyContin to long-acting morphine on February 11, wondering if this may be contributing to his progressive symptoms.  At the time of my examination, patient was alert, awake, oriented to self, date of birth and not place.  Patient denied any cough, runny nose, sore throat, chest pain, fever/chills, shortness of breath, nausea/vomiting, diarrhea, headaches.  Reports some  remote history of falls, but none currently.  Patient does report chronic generalized abdominal pain likely from pancreatic cancer.  Patient has been fully vaccinated including a booster shot in late 2021.  Of note, patient and wife moved from [place] to [place] to be close to their daughter and for much quiet life.  Patient follows with Dr. Benay Spice and just had an appointment on 04/26/2020.  Patient and wife requesting hospice services.  Patient admitted for further management for acute metabolic encephalopathy as well as failure to thrive.     Today, patient denies any new complaints, mentation back to baseline.  Patient eager to be discharged home with hospice.  Wife at bedside, discussed discharge plans, they both verbalized understanding.    Hospital Course:  Principal Problem:   Acute metabolic encephalopathy Active Problems:   Cancer of pancreas, body ([place])   Diabetes mellitus without complication ([place])   Acute metabolic encephalopathy Improved Likely multifactorial, medication induced/polypharnmacy, failure to thrive/poor oral intake, progression of malignancy, rule out infection Currently afebrile, no leukocytosis Ammonia 54 BCx2-->NGTD UA unremarkable, UC pending- PCP/oncology to follow up  Covid negative Chest x-ray unremarkable CT head unremarkable for any acute intracranial changes MRI brain, unremarkable Medication adjusted by oncology as recommended, follow-up with home hospice for further management  Hyponatremia Likely 2/2 poor oral intake S/P IV fluids  Diabetes mellitus type 2, uncontrolled with hyperglycemia A1c-->10.3 Diabetes coordinator consulted, after detailed discussion with patient and wife, agreed for Lantus 5 units at bedtime, with 3 times daily NovoLog sliding scale 0 to 9 units Diabetes coordinator consulted, discussed extensively with patient  and wife about insulin use, hypoglycemic symptoms and demonstrated the use of insulin  pens Follow-up with home with hospice  Normocytic anemia Likely multifactorial, malignancy, poor oral intake  Locally advanced pancreatic cancer Diagnosed in May 2020, completed multiple rounds of chemo with progression of disease Follows with Dr. Benay Spice, last seen on 04/26/2020, outpatient follow-up Pain management  History of bleeding duodenal ulcer in December 2021 Continue PPI  Anorexia/weight loss/failure to thrive Likely 2/2 advanced pancreatic cancer Dietitian consult, PT consult  Goals of care discussion Patient with very poor prognosis, with poor performance status DNR Patient discharged with home with hospice      Malnutrition Type:  Nutrition Problem: Increased nutrient needs Etiology: cancer and cancer related treatments   Malnutrition Characteristics:  Signs/Symptoms: estimated needs   Nutrition Interventions:  Interventions: Glucerna shake,MVI   Estimated body mass index is 20.85 kg/m as calculated from the following:   Height as of 04/26/20: '5\' 10"'  (1.778 m).   Weight as of this encounter: 65.9 kg.    Procedures:  None  Consultations:  Palliative/hospice  Oncology  Diabetes coordinator    Discharge Exam: BP 137/89 (BP Location: Left Arm)   Pulse 80   Temp 98.7 F (37.1 C) (Oral)   Resp 16   Wt 65.9 kg   SpO2 100%   BMI 20.85 kg/m    General: NAD, cachectic, frail, oriented x4 Cardiovascular: S1, S2 present Respiratory: CTA B    Discharge Instructions You were cared for by a hospitalist during your hospital stay. If you have any questions about your discharge medications or the care you received while you were in the hospital after you are discharged, you can call the unit and asked to speak with the hospitalist on call if the hospitalist that took care of you is not available. Once you are discharged, your primary care physician will handle any further medical issues. Please note that NO REFILLS for any discharge  medications will be authorized once you are discharged, as it is imperative that you return to your primary care physician (or establish a relationship with a primary care physician if you do not have one) for your aftercare needs so that they can reassess your need for medications and monitor your lab values.  Discharge Instructions    Diet - low sodium heart healthy   Complete by: As directed    Increase activity slowly   Complete by: As directed      Allergies as of 04/29/2020      Reactions   Ibuprofen Other (See Comments)   Can't take due to stomach ulcer   Tylenol [acetaminophen] Other (See Comments)   Can't take due to stomach ulcer      Medication List    STOP taking these medications   gabapentin 600 MG tablet Commonly known as: NEURONTIN   morphine 30 MG 12 hr tablet Commonly known as: MS CONTIN     TAKE these medications   blood glucose meter kit and supplies Kit Dispense based on patient and insurance preference. Use up to four times daily as directed.   docusate sodium 100 MG capsule Commonly known as: COLACE Take 200 mg by mouth 2 (two) times daily.   Lantus SoloStar 100 UNIT/ML Solostar Pen Generic drug: insulin glargine Inject 5 Units into the skin at bedtime.   NovoLOG FlexPen 100 UNIT/ML FlexPen Generic drug: insulin aspart Inject 0-9 Units into the skin 3 (three) times daily with meals. CBG 70 - 120: 0 units CBG 121 -  150: 1 unit CBG 151 - 200: 2 units CBG 201 - 250: 3 units CBG 251 - 300: 5 units CBG 301 - 350: 7 units CBG 351 - 400: 9 units CBG > 400: call MD   ondansetron 8 MG tablet Commonly known as: ZOFRAN Take 8 mg by mouth every 8 (eight) hours as needed for nausea or vomiting.   Oxycodone HCl 20 MG Tabs Take 20 mg by mouth every 4 (four) hours as needed (pain).   pantoprazole 40 MG tablet Commonly known as: PROTONIX Take 40 mg by mouth 2 (two) times daily.   Pen Needles 3/16" 31G X 5 MM Misc Use with insulin pen       Allergies  Allergen Reactions  . Ibuprofen Other (See Comments)    Can't take due to stomach ulcer  . Tylenol [Acetaminophen] Other (See Comments)    Can't take due to stomach ulcer      The results of significant diagnostics from this hospitalization (including imaging, microbiology, ancillary and laboratory) are listed below for reference.    Significant Diagnostic Studies: CT HEAD WO CONTRAST  Result Date: 04/28/2020 CLINICAL DATA:  Confusion/altered mental status for 2 days. EXAM: CT HEAD WITHOUT CONTRAST TECHNIQUE: Contiguous axial images were obtained from the base of the skull through the vertex without intravenous contrast. COMPARISON:  None. FINDINGS: Brain: There is no evidence of an acute infarct, intracranial hemorrhage, mass, midline shift, or extra-axial fluid collection. The ventricles and sulci are normal. Vascular: Calcified atherosclerosis at the skull base. No hyperdense vessel. Skull: No fracture or suspicious osseous lesion. Sinuses/Orbits: Unremarkable orbits. Paranasal sinuses and mastoid air cells are clear. Other: None. IMPRESSION: Unremarkable CT appearance of the brain. Electronically Signed   By: Logan Bores M.D.   On: 04/28/2020 15:00   MR BRAIN WO CONTRAST  Result Date: 04/28/2020 CLINICAL DATA:  Mental status changes with confusion. Negative CT evaluation. EXAM: MRI HEAD WITHOUT CONTRAST TECHNIQUE: Multiplanar, multiecho pulse sequences of the brain and surrounding structures were obtained without intravenous contrast. COMPARISON:  CT same day FINDINGS: Brain: The brain has a normal appearance without evidence of malformation, atrophy, old or acute small or large vessel infarction, mass lesion, hemorrhage, hydrocephalus or extra-axial collection. Vascular: Major vessels at the base of the brain show flow. Venous sinuses appear patent. Skull and upper cervical spine: Normal. Sinuses/Orbits: Clear/normal. Other: None significant. IMPRESSION: Normal head CT.  Electronically Signed   By: Nelson Chimes M.D.   On: 04/28/2020 19:00   DG Chest Port 1 View  Result Date: 04/28/2020 CLINICAL DATA:  Altered mental status EXAM: PORTABLE CHEST 1 VIEW COMPARISON:  None. FINDINGS: Right chest wall port catheter overlies SVC. Low lung volumes. No consolidation or edema. No pleural effusion. Heart size is normal. IMPRESSION: Low lung volumes. Electronically Signed   By: Macy Mis M.D.   On: 04/28/2020 14:37    Microbiology: Recent Results (from the past 240 hour(s))  Blood Cultures (routine x 2)     Status: None (Preliminary result)   Collection Time: 04/28/20  1:00 PM   Specimen: BLOOD  Result Value Ref Range Status   Specimen Description   Final    BLOOD PORTA CATH Performed at [facility] [address], [place], [place] [address]    Special Requests   Final    BOTTLES DRAWN AEROBIC AND ANAEROBIC Blood Culture adequate volume Performed at [facility] [address], [place],  [address]    Culture   Final  NO GROWTH < 24 HOURS Performed at [facility] [address], [place], [place] [address]    Report Status PENDING  Incomplete  Resp Panel by RT-PCR (Flu A&B, Covid) Nasopharyngeal Swab     Status: None   Collection Time: 04/28/20  1:00 PM   Specimen: Nasopharyngeal Swab; Nasopharyngeal(NP) swabs in vial transport medium  Result Value Ref Range Status   SARS Coronavirus 2 by RT PCR NEGATIVE NEGATIVE Final    Comment: (NOTE) SARS-CoV-2 target nucleic acids are NOT DETECTED.  The SARS-CoV-2 RNA is generally detectable in upper respiratory specimens during the acute phase of infection. The lowest concentration of SARS-CoV-2 viral copies this assay can detect is 138 copies/mL. A negative result does not preclude SARS-Cov-2 infection and should not be used as the sole basis for treatment or other patient management decisions. A negative result may occur with  improper specimen  collection/handling, submission of specimen other than nasopharyngeal swab, presence of viral mutation(s) within the areas targeted by this assay, and inadequate number of viral copies(<138 copies/mL). A negative result must be combined with clinical observations, patient history, and epidemiological information. The expected result is Negative.  Fact Sheet for Patients:  EntrepreneurPulse.com.au  Fact Sheet for Healthcare Providers:  IncredibleEmployment.be  This test is no t yet approved or cleared by the [place] FDA and  has been authorized for detection and/or diagnosis of SARS-CoV-2 by FDA under an Emergency Use Authorization (EUA). This EUA will remain  in effect (meaning this test can be used) for the duration of the COVID-19 declaration under Section 564(b)(1) of the Act, 21 U.S.C.section 360bbb-3(b)(1), unless the authorization is terminated  or revoked sooner.       Influenza A by PCR NEGATIVE NEGATIVE Final   Influenza B by PCR NEGATIVE NEGATIVE Final    Comment: (NOTE) The Xpert Xpress SARS-CoV-2/FLU/RSV plus assay is intended as an aid in the diagnosis of influenza from Nasopharyngeal swab specimens and should not be used as a sole basis for treatment. Nasal washings and aspirates are unacceptable for Xpert Xpress SARS-CoV-2/FLU/RSV testing.  Fact Sheet for Patients: EntrepreneurPulse.com.au  Fact Sheet for Healthcare Providers: IncredibleEmployment.be  This test is not yet approved or cleared by the [place] FDA and has been authorized for detection and/or diagnosis of SARS-CoV-2 by FDA under an Emergency Use Authorization (EUA). This EUA will remain in effect (meaning this test can be used) for the duration of the COVID-19 declaration under Section 564(b)(1) of the Act, 21 U.S.C. section 360bbb-3(b)(1), unless the authorization is terminated or revoked.  Performed at [facility], [place] [address], [place], [place] [address]      Labs: Basic Metabolic Panel: Recent Labs  Lab 04/28/20 1300 04/29/20 0531  NA 133* 134*  K 4.0 3.7  CL 96* 99  CO2 27 27  GLUCOSE 409* 219*  BUN 14 10  CREATININE 0.66 0.47*  CALCIUM 9.8 9.4   Liver Function Tests: Recent Labs  Lab 04/28/20 1300  AST 24  ALT 27  ALKPHOS 101  BILITOT 0.8  PROT 7.1  ALBUMIN 3.0*   No results for input(s): LIPASE, AMYLASE in the last 168 hours. Recent Labs  Lab 04/28/20 1300 04/29/20 0531  AMMONIA 54* 58*   CBC: Recent Labs  Lab 04/28/20 1300 04/29/20 0531  WBC 7.1 7.0  NEUTROABS 5.2  --   HGB 10.8* 10.6*  HCT 33.7* 31.9*  MCV 87.3 86.4  PLT 226 207   Cardiac Enzymes: No results for input(s):  CKTOTAL, CKMB, CKMBINDEX, TROPONINI in the last 168 hours. BNP: BNP (last 3 results) No results for input(s): BNP in the last 8760 hours.  ProBNP (last 3 results) No results for input(s): PROBNP in the last 8760 hours.  CBG: Recent Labs  Lab 04/28/20 1259 04/28/20 1725 04/28/20 2103 04/29/20 0747 04/29/20 1118  GLUCAP 426* 323* 337* 209* 251*       Signed:  Alma Friendly, MD [facility] 04/29/2020, 4:48 PM

## 2020-04-29 NOTE — Progress Notes (Signed)
Initial Nutrition Assessment  INTERVENTION:   -Glucerna Shake po TID, each supplement provides 220 kcal and 10 grams of protein  -Multivitamin with minerals daily  NUTRITION DIAGNOSIS:   Increased nutrient needs related to cancer and cancer related treatments as evidenced by estimated needs.  GOAL:   Patient will meet greater than or equal to 90% of their needs  MONITOR:   PO intake,Supplement acceptance  REASON FOR ASSESSMENT:   Consult Assessment of nutrition requirement/status  ASSESSMENT:   60 y.o. male with medical history significant for inoperable pancreatic cancer diagnosed in May 2020, diabetes, history of bleeding duodenal ulcer in December 2021, presents to the ED due to altered mental status.  Per chart review, pt now discharging with hospice.  Consuming 25% of meals at this time. At home, spouse reported pt mainly consuming Ensure supplements.  Will order Glucerna shakes. Palliative care has been consulted for [place]. Per oncology note, pt with progressive pancreatic cancer. No longer a candidate for further chemotherapy.   Per weight records, pt has lost 50 lbs since March 2021 (25% wt loss x 11 months, significant for time frame).  Medications: Colace  Labs reviewed: CBGs: 209-337 Low Na  NUTRITION - FOCUSED PHYSICAL EXAM:  Deferred.  Diet Order:   Diet Order            Diet Carb Modified Fluid consistency: Thin; Room service appropriate? Yes  Diet effective now                 EDUCATION NEEDS:   No education needs have been identified at this time  Skin:  Skin Assessment: Reviewed RN Assessment  Last BM:  2/23  Height:   Ht Readings from Last 1 Encounters:  04/26/20 5\' 10"  (1.778 m)    Weight:   Wt Readings from Last 1 Encounters:  04/28/20 65.9 kg   BMI:  Body mass index is 20.85 kg/m.  Estimated Nutritional Needs:   Kcal:  2000-2200  Protein:  75-85g  Fluid:  2L/day  Jeremy Bibles, MS, RD, LDN Inpatient Clinical  Dietitian Contact information available via Amion

## 2020-04-29 NOTE — Progress Notes (Signed)
Chaplain engaged in initial visit with Jeremy Horton and his wife.  Chaplain introduced herself and offered support.  They shared that they are Jehov[employer] Witnesses.  Chaplain let them know that she can assist in having their spiritual leaders or clergy person available to support them.    [name] expressed excitement over discharging today.  Chaplain is available to follow-up as needed.    04/29/20 1600  Clinical Encounter Type  Visited With Patient and family together  Visit Type Initial

## 2020-04-29 NOTE — Progress Notes (Addendum)
Manufacturing engineer [facility])  Addendum:  Spoke again with Mrs Rion, no new unmet needs at this times.  [facility] will call after discharge to set up admission visit.  Received request from [facility] for hospice services at home after discharge.  Chart and pt information under review by [facility] physician.  Hospice eligibility pending at this time.  Hospital liaison spoke very briefly with pt's spouse Herb Grays to initiate education related to hospice philosophy and services and to answer any questions at this time.  Maritza shared PT had come into the room and asked to call back.  Contact information given.  [place] send signed and completed DNR home with pt/family.  Please provide prescriptions at discharge as needed to ensure ongoing symptom management until pt can be admitted onto hospice.    DME needs not discussed on this brief call.  To be addressed on follow up call.  Address has been verified and is correct in the chart.  ACC information and contact numbers given to [facility].  Above information shared with Jorene Guest Manager.  Please call with any questions or concerns.  Thank you for the opportunity to participate in this pt's care.  Domenic Moras, BSN, RN [facility] [phone] (24h on call)

## 2020-04-29 NOTE — Progress Notes (Addendum)
HEMATOLOGY-ONCOLOGY PROGRESS NOTE  SUBJECTIVE: Jeremy Horton recently establish care with [place] for progressive pancreatic cancer.  As discussed at the office visit on 2/22, the patient has not a candidate for additional systemic therapy due to poor performance status.  He opted to enroll with hospice and agreed to DNR.  A referral to hospice has been made, but the patient has not yet been enrolled with their services.  He was brought to the hospital by family due to altered mental status.  The patient's wife tells me that he was more somnolent and confused yesterday.  She thinks that he is better overall today.  His pain is currently controlled.  He offers no other complaints.  He wants to go home.  Oncology History  Cancer of pancreas, body ([place])  04/26/2020 Initial Diagnosis   Cancer of pancreas, body ([place])   04/26/2020 Cancer Staging   Staging form: Exocrine Pancreas, AJCC 8th Edition - Clinical: Stage IIB (cT3, cN1, cM0) - Signed by Ladell Pier, MD on 04/26/2020    PHYSICAL EXAMINATION:  Vitals:   04/29/20 0004 04/29/20 0515  BP: (!) 135/95 137/89  Pulse: 91 80  Resp: 16   Temp: 98.3 F (36.8 C) 98.7 F (37.1 C)  SpO2: 98% 100%   Filed Weights   04/28/20 1900  Weight: 65.9 kg    Intake/Output from previous day: 02/24 0701 - 02/25 0700 In: 951.9 [I.V.:951.9] Out: 200 [Urine:200]  GENERAL: Chronically ill-appearing, cachectic SKIN: skin color, texture, turgor are normal, no rashes or significant lesions LUNGS: clear to auscultation and percussion with normal breathing effort HEART: regular rate & rhythm and no murmurs and no lower extremity edema ABDOMEN: Positive bowel sounds, mild generalized tenderness with palpation, nondistended NEURO: alert & oriented x 3  LABORATORY DATA:  I have reviewed the data as listed CMP Latest Ref Rng & Units 04/29/2020 04/28/2020  Glucose 70 - 99 mg/dL 219(H) 409(H)  BUN 6 - 20 mg/dL 10 14  Creatinine 0.61 - 1.24 mg/dL 0.47(L) 0.66   Sodium 135 - 145 mmol/L 134(L) 133(L)  Potassium 3.5 - 5.1 mmol/L 3.7 4.0  Chloride 98 - 111 mmol/L 99 96(L)  CO2 22 - 32 mmol/L 27 27  Calcium 8.9 - 10.3 mg/dL 9.4 9.8  Total Protein 6.5 - 8.1 g/dL - 7.1  Total Bilirubin 0.3 - 1.2 mg/dL - 0.8  Alkaline Phos 38 - 126 U/L - 101  AST 15 - 41 U/L - 24  ALT 0 - 44 U/L - 27    Lab Results  Component Value Date   WBC 7.0 04/29/2020   HGB 10.6 (L) 04/29/2020   HCT 31.9 (L) 04/29/2020   MCV 86.4 04/29/2020   PLT 207 04/29/2020   NEUTROABS 5.2 04/28/2020    CT HEAD WO CONTRAST  Result Date: 04/28/2020 CLINICAL DATA:  Confusion/altered mental status for 2 days. EXAM: CT HEAD WITHOUT CONTRAST TECHNIQUE: Contiguous axial images were obtained from the base of the skull through the vertex without intravenous contrast. COMPARISON:  None. FINDINGS: Brain: There is no evidence of an acute infarct, intracranial hemorrhage, mass, midline shift, or extra-axial fluid collection. The ventricles and sulci are normal. Vascular: Calcified atherosclerosis at the skull base. No hyperdense vessel. Skull: No fracture or suspicious osseous lesion. Sinuses/Orbits: Unremarkable orbits. Paranasal sinuses and mastoid air cells are clear. Other: None. IMPRESSION: Unremarkable CT appearance of the brain. Electronically Signed   By: Logan Bores M.D.   On: 04/28/2020 15:00   MR BRAIN WO CONTRAST  Result Date:  04/28/2020 CLINICAL DATA:  Mental status changes with confusion. Negative CT evaluation. EXAM: MRI HEAD WITHOUT CONTRAST TECHNIQUE: Multiplanar, multiecho pulse sequences of the brain and surrounding structures were obtained without intravenous contrast. COMPARISON:  CT same day FINDINGS: Brain: The brain has a normal appearance without evidence of malformation, atrophy, old or acute small or large vessel infarction, mass lesion, hemorrhage, hydrocephalus or extra-axial collection. Vascular: Major vessels at the base of the brain show flow. Venous sinuses appear  patent. Skull and upper cervical spine: Normal. Sinuses/Orbits: Clear/normal. Other: None significant. IMPRESSION: Normal head CT. Electronically Signed   By: Nelson Chimes M.D.   On: 04/28/2020 19:00   DG Chest Port 1 View  Result Date: 04/28/2020 CLINICAL DATA:  Altered mental status EXAM: PORTABLE CHEST 1 VIEW COMPARISON:  None. FINDINGS: Right chest wall port catheter overlies SVC. Low lung volumes. No consolidation or edema. No pleural effusion. Heart size is normal. IMPRESSION: Low lung volumes. Electronically Signed   By: Macy Mis M.D.   On: 04/28/2020 14:37    ASSESSMENT AND PLAN: 1. Pancreas cancer diagnosed in May 2020 ? 8 cycles of FOLFIRINOX beginning 08/05/2018 ? MRI abdomen 11/03/2018-tumor progression with mass encasing celiac artery ? 11/23/2018-started gemcitabine/Abraxane ? 01/28/2019-CT with progressive disease, 5.6 and meter mass encasing celiac artery, less than 180 SMA involvement, 1.6 cm superior peripancreatic node ? Completed 4 cycles of gem/Abraxane 02/10/2019 ? SBRT, 5 fractions (50 Gy) completed 04/17/2019 ? Gemcitabine/Abraxane continued with stable to slightly progressive disease ? 07/29/2019 CT-stable disease with mass encasing the celiac axis ? October 2021-CTs with progressive disease, treatment discontinued  2. Admission December 2021 with a bleeding duodenal ulcer treated with bipolar cautery 3. Anorexia/weight loss secondary to #1 4. Pain secondary to #1 5. Sacral decubitus ulcers 6. Jehov[employer]'s Witness  Jeremy Horton has a history of locally advanced pancreatic cancer diagnosed in May 2020.  He has had disease progression.  Performance status is poor.  As discussed at our initial consult with him in our office on 04/26/2020, he is not a candidate for any additional chemotherapy due to his poor performance status.  Discussed CPR and ACLS and he agreed to DNR.  A referral to hospice has already been placed.   The patient's pain is currently overall well  controlled.  He has had some intermittent somnolence and confusion possibly due to pain medication.  Will review pain medications and make adjustments accordingly.  TOC consult has been placed to make arrangements for hospice prior to discharge.  The patient is anxious to go home and from our standpoint once hospice has been arranged, he may be discharged.  Recommendations: 1.  TOC consult to make arrangements for hospice. 2.  Discharge to home with hospice care 3.  Discontinue MS Contin and gabapentin 4.  Continue oxycodone as needed for pain   LOS: 1 day   Jeremy Bussing, DNP, AGPCNP-BC, AOCNP 04/29/20 Mr. Hackler was interviewed and examined.  He was admitted yesterday with altered mental status.  He is more alert today.  He appears significantly more alert compared to last saw him earlier this week.  I suspect altered mental status is related to polypharmacy and critical illness.  He is a candidate for hospice care.  I recommend discontinuing gabapentin and MS Contin for now.  He can continue oxycodone as needed.  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  We can resume low-dose MS Contin (15 mg) if his pain is not controlled with oxycodone.  The narcotic regimen can be adjusted by the home hospice team.  I was present for greater than 50% of today's visit.  I performed medical decision making.

## 2020-04-29 NOTE — Evaluation (Signed)
Physical Therapy Evaluation Patient Details Name: Jeremy Horton MRN: [id number] DOB: May 20, 1960 Today's Date: 04/29/2020   History of Present Illness  Patient is a 60 y.o. male presenting to [facility] for [place], decreased p.o. intake, poor sleep, and progressive weight loss admitted for acute metabolic encephalopathy with PMH significant for inoperable pancreatic cancer and DM.  Clinical Impression  Pt is a 60 y.o. male with above HPI. Pt reports that he is independent with mobility at baseline. Pt required MIN guard- supervision for safety with sit to stand transfers. Pt required MIN guard progressing to supervision 265ft with with no LOB.  Pt is currently at safe mobility level for discharge. Pt's wife will be available to assist at home. Recommend home with family support. Acute PT to sign off, pt is safe to continue mobilizing with nursing staff.            Follow Up Recommendations No PT follow up    Equipment Recommendations  None recommended by PT    Recommendations for Other Services       Precautions / Restrictions Precautions Precautions: Fall Precaution Comments: 1 fall in December. Pt's wife suspected medication Restrictions Weight Bearing Restrictions: No      Mobility  Bed Mobility Overal bed mobility: Needs Assistance Bed Mobility: Supine to Sit     Supine to sit: Supervision;HOB elevated     General bed mobility comments: use of B UEs to scoot to EOB with supervision for safety.    Transfers Overall transfer level: Needs assistance Equipment used: None Transfers: Sit to/from Stand Sit to Stand: Min guard;Supervision         General transfer comment: MIN guard-supervision for safety from EOB and recliner.  Ambulation/Gait Ambulation/Gait assistance: Min guard;Supervision Gait Distance (Feet): 200 Feet Assistive device: None Gait Pattern/deviations: Step-through pattern;Decreased stride length;Decreased dorsiflexion - left;Decreased dorsiflexion -  right Gait velocity: fair   General Gait Details: MIN guard progressing to supervision for safety with no LOB. Pt reports his gait is slightly slower compared to baseline. mild balance deficits obxerved however no LOB.  Stairs            Wheelchair Mobility    Modified Rankin (Stroke Patients Only)       Balance Overall balance assessment: Needs assistance Sitting-balance support: Feet supported Sitting balance-Leahy Scale: Good     Standing balance support: During functional activity;No upper extremity supported Standing balance-Leahy Scale: Good   Single Leg Stance - Right Leg: 4 Single Leg Stance - Left Leg: 4         High level balance activites: Head turns High Level Balance Comments: pt demonstrated drifitng with side to side head turns and was able to increase gait speed and perform vertical head turns requiring MIN guard for safety with no LOB.             Pertinent Vitals/Pain Pain Assessment: No/denies pain    Home Living Family/patient expects to be discharged to:: Private residence Living Arrangements: Spouse/significant other Available Help at Discharge: Family Type of Home: Apartment Home Access: Level entry     Home Layout: One level Home Equipment: Environmental consultant - 4 wheels;Bedside commode;Grab bars - toilet Additional Comments: Pt lives with his wife and his daughter is also available intermittently to assist at home.    Prior Function Level of Independence: Independent               Hand Dominance   Dominant Hand: Right    Extremity/Trunk Assessment   Upper Extremity Assessment  Upper Extremity Assessment: Overall WFL for tasks assessed    Lower Extremity Assessment Lower Extremity Assessment: Generalized weakness (5xSit<>Stand: 28.32 with Bil UE use on armrests)    Cervical / Trunk Assessment Cervical / Trunk Assessment: Normal  Communication   Communication: No difficulties  Cognition Arousal/Alertness:  Awake/alert Behavior During Therapy: WFL for tasks assessed/performed Overall Cognitive Status: Impaired/Different from baseline Area of Impairment: Orientation;Attention                 Orientation Level: Disoriented to;Time Current Attention Level: Selective           General Comments: pt able to verbalize full name and birthdate. Pt disortiended to current month, date, and year. Pt able to correctly identify day of the week as friday. pt's wife reports pt much improved since he arrived at hospital.      General Comments      Exercises     Assessment/Plan    PT Assessment Patent does not need any further PT services  PT Problem List Decreased strength;Decreased activity tolerance;Decreased balance;Decreased mobility       PT Treatment Interventions DME instruction;Gait training;Functional mobility training;Therapeutic activities;Therapeutic exercise;Balance training;Patient/family education    PT Goals (Current goals can be found in the Care Plan section)  Acute Rehab PT Goals Patient Stated Goal: none stated PT Goal Formulation: With patient/family Time For Goal Achievement: 05/13/20 Potential to Achieve Goals: Fair    Frequency Other (Comment) (1x eval)   Barriers to discharge        Co-evaluation               AM-PAC PT "6 Clicks" Mobility  Outcome Measure Help needed turning from your back to your side while in a flat bed without using bedrails?: None Help needed moving from lying on your back to sitting on the side of a flat bed without using bedrails?: None Help needed moving to and from a bed to a chair (including a wheelchair)?: A Little Help needed standing up from a chair using your arms (e.g., wheelchair or bedside chair)?: A Little Help needed to walk in hospital room?: A Little Help needed climbing 3-5 steps with a railing? : A Little 6 Click Score: 20    End of Session Equipment Utilized During Treatment: Gait belt Activity  Tolerance: Patient tolerated treatment well Patient left: in chair;with call bell/phone within reach;with family/visitor present Nurse Communication: Mobility status PT Visit Diagnosis: Unsteadiness on feet (R26.81);Muscle weakness (generalized) (M62.81);History of falling (Z91.81)    Time: 7412-8786 PT Time Calculation (min) (ACUTE ONLY): 30 min   Charges:   PT Evaluation $PT Eval Low Complexity: 1 Low          Graelyn Bihl, SPT  Acute rehab    Karim Aiello 04/29/2020, 7:12 PM

## 2020-04-29 NOTE — TOC Initial Note (Signed)
Transition of Care [facility]) - Initial/Assessment Note    Patient Details  Name: Jeremy Horton MRN: [id number] Date of Birth: Aug 24, 1960  [facility] CM/SW Contact:    Lynnell Catalan, RN Phone Number: 04/29/2020, 1:19 PM  Clinical Narrative:                 [facility] consult for home hospice. Met with pt and wife in the room for dc planning. They moved here from [place] one month ago to be closer to grandchild. Choice offered for home hospice and [facility] chosen. [facility] liaison contacted for referral. Pt is ambulatory and they do not think they need any DME at this time.   Expected Discharge Plan: Home w Hospice Care Barriers to Discharge: Continued Medical Work up   Patient Goals and CMS Choice Patient states their goals for this hospitalization and ongoing recovery are:: To get home CMS Medicare.gov Compare Post Acute Care list provided to:: Patient Choice offered to / list presented to : Patient  Expected Discharge Plan and Services Expected Discharge Plan: Home w Hospice Care   Discharge Planning Services: CM Consult Post Acute Care Choice: Hospice Living arrangements for the past 2 months: Apartment                           HH Arranged: Disease Management [place] Agency: Hospice and [place] Date [facility] Agency Contacted: 04/29/20 Time HH Agency Contacted: 1100 Representative spoke with at [place]: [place] Arrangements/Services Living arrangements for the past 2 months: Apartment Lives with:: Spouse Patient language and need for interpreter reviewed:: Yes Do you feel safe going back to the place where you live?: Yes      Need for Family Participation in Patient Care: Yes (Comment) Care giver support system in place?: Yes (comment)   Criminal Activity/Legal Involvement Pertinent to Current Situation/Hospitalization: No - Comment as needed  Activities of Daily Living Home Assistive Devices/Equipment: Eyeglasses ADL  Screening (condition at time of admission) Patient's cognitive ability adequate to safely complete daily activities?: Yes Is the patient deaf or have difficulty hearing?: No Does the patient have difficulty seeing, even when wearing glasses/contacts?: No Does the patient have difficulty concentrating, remembering, or making decisions?: Yes (has had moments) Patient able to express need for assistance with ADLs?: Yes Does the patient have difficulty dressing or bathing?: Yes Independently performs ADLs?: No Communication: Independent Dressing (OT): Needs assistance Is this a change from baseline?: Change from baseline, expected to last >3 days Grooming: Independent Feeding: Independent Bathing: Needs assistance Is this a change from baseline?: Change from baseline, expected to last >3 days Toileting: Needs assistance Is this a change from baseline?: Change from baseline, expected to last >3days In/Out Bed: Needs assistance Is this a change from baseline?: Change from baseline, expected to last >3 days Walks in Home: Needs assistance Is this a change from baseline?: Change from baseline, expected to last >3 days Does the patient have difficulty walking or climbing stairs?: Yes (secondary to worsening weakness) Weakness of Legs: Both Weakness of Arms/Hands: Both  Permission Sought/Granted                  Emotional Assessment Appearance:: Appears older than stated age Attitude/Demeanor/Rapport: Gracious Affect (typically observed): Calm Orientation: : Oriented to Self,Oriented to Place,Oriented to  Time,Oriented to Situation Alcohol / Substance Use: Not Applicable    Admission diagnosis:  Altered mental status, unspecified altered mental status type [R41.82]  Acute metabolic encephalopathy [G81.85] Patient Active Problem List   Diagnosis Date Noted  . Acute metabolic encephalopathy 63/14/9702  . Diabetes mellitus without complication ([place])   . Cancer of pancreas, body ([place])  04/26/2020   PCP:  Ladell Pier, MD Pharmacy:   [place], [place] - [address] [place] [address] Phone: [phone] Fax: [phone]     Social Determinants of Health (SDOH) Interventions    Readmission Risk Interventions No flowsheet data found.

## 2020-04-30 ENCOUNTER — Other Ambulatory Visit: Payer: Self-pay | Admitting: Hematology & Oncology

## 2020-04-30 LAB — URINE CULTURE: Culture: NO GROWTH

## 2020-04-30 MED ORDER — OXYCODONE HCL 20 MG PO TABS: 20.0000 mg | ORAL_TABLET | ORAL | 0 refills | Status: DC | PRN

## 2020-05-02 ENCOUNTER — Telehealth: Payer: Self-pay | Admitting: *Deleted

## 2020-05-02 NOTE — Telephone Encounter (Signed)
Has been having uncontrolled pain at 7/10. Dr. Lyman Speller increased his oxycodone dose to 40 mg every 4 hours prn and approved to resume his MS Contin at 60 mg every 12 hours. Asking if Dr. Benay Spice would like to do the standing order Senna-S for bowels or put him on lactulose since his ammonia level was elevated?

## 2020-05-03 LAB — CULTURE, BLOOD (ROUTINE X 2)
Culture: NO GROWTH
Special Requests: ADEQUATE

## 2020-05-03 NOTE — Telephone Encounter (Signed)
Notified hospice RN that Dr. Benay Spice is OK with the change to MS Contin 60 mg every 12 hours and to increase prn oxycodone to 40 mg prn. He currently has enough medication.

## 2020-05-13 ENCOUNTER — Telehealth: Payer: Self-pay | Admitting: *Deleted

## 2020-05-13 NOTE — Telephone Encounter (Signed)
Dr. Lyman Speller refilled his MS Contin and increased dose to 60 mg every 12 hours. He looks very pale and skin is cool with temporal waisting. Does Dr. Benay Spice want Dr. Lyman Speller to manage his pain? Per Dr. Benay Spice: He would welcome Dr. Lyman Speller to manage his pain. Nurse aware.

## 2020-05-20 ENCOUNTER — Inpatient Hospital Stay: Payer: Medicaid - Out of State | Admitting: Nurse Practitioner

## 2020-05-20 ENCOUNTER — Telehealth: Payer: Self-pay | Admitting: *Deleted

## 2020-05-20 ENCOUNTER — Telehealth: Payer: Self-pay | Admitting: Nurse Practitioner

## 2020-05-20 NOTE — Telephone Encounter (Signed)
Rescheduled 03/18 appointment to 03/22 per patient's request.

## 2020-05-20 NOTE — Telephone Encounter (Signed)
Wife left message apologizing for the appointment missed today. She reports she had left VM to cancel. Wishes to reschedule for next week. Scheduling message sent.

## 2020-05-23 ENCOUNTER — Telehealth: Payer: Self-pay | Admitting: Oncology

## 2020-05-23 NOTE — Telephone Encounter (Signed)
Scheduled per sch msg. Called and spoke with patients wife. Confirmed appt  

## 2020-05-24 ENCOUNTER — Ambulatory Visit: Payer: Medicaid - Out of State | Admitting: Nurse Practitioner

## 2020-05-24 ENCOUNTER — Telehealth: Payer: Self-pay | Admitting: *Deleted

## 2020-05-24 NOTE — Telephone Encounter (Addendum)
Wife asking if his 05/26/20 appointment could be changed to virtual appointment? Too difficult to get him out of the home now. Under hospice care. Sent [facility] scheduling message to move his visit on 3/24 to 10:00 with Dr. Benay Spice for virtual/mychart visit. Requested for them to call wife.

## 2020-05-25 ENCOUNTER — Other Ambulatory Visit: Payer: Self-pay | Admitting: Oncology

## 2020-05-25 ENCOUNTER — Telehealth: Payer: Self-pay | Admitting: Oncology

## 2020-05-25 ENCOUNTER — Telehealth: Payer: Self-pay | Admitting: *Deleted

## 2020-05-25 MED ORDER — MORPHINE SULFATE ER 100 MG PO TBCR: 100.0000 mg | EXTENDED_RELEASE_TABLET | Freq: Two times a day (BID) | ORAL | 0 refills | Status: AC

## 2020-05-25 MED ORDER — OXYCODONE HCL 20 MG PO TABS: 20.0000 mg | ORAL_TABLET | ORAL | 0 refills | Status: AC | PRN

## 2020-05-25 MED ORDER — SENNOSIDES-DOCUSATE SODIUM 8.6-50 MG PO TABS: 2.0000 | ORAL_TABLET | Freq: Two times a day (BID) | ORAL | 1 refills | Status: AC

## 2020-05-25 NOTE — Telephone Encounter (Signed)
Wife notified that visit tomorrow is video visit. MD will increase MS Contin to 100 mg bid and will refill the oxycodone as well to continue to use prn. Also per Dr. Benay Spice: D/C the night Lantus insulin. Will go over everything in more detail tomorrow at visit.

## 2020-05-25 NOTE — Telephone Encounter (Signed)
Changed appt to mychart visit. Called and spoke with pt wife. Confirmed change

## 2020-05-25 NOTE — Telephone Encounter (Signed)
Hospice RN called with following needs/issues: 1. Wife wants to change his OV to virtual/Mychart visit (this has been done). 2. Getting more confused, looks pale and is very frail. Spends most of day lying on sofa. 3. Pain not well controlled. Currently on MS Contin 60 mg every 12 hours and oxcodone 20 mg prn. He is taking 7 1/2 tabs of oxycodone 20 mg every 24 hours. Needs refill on oxycodone and asking to increase MS Contin as well. 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 4. Blood sugars have been running low. On 5 units Lantus at night and sliding scale during the day w/Novolog

## 2020-05-26 ENCOUNTER — Inpatient Hospital Stay: Payer: Medicaid - Out of State | Admitting: Nurse Practitioner

## 2020-05-26 ENCOUNTER — Inpatient Hospital Stay: Payer: Medicaid - Out of State | Attending: Oncology | Admitting: Oncology

## 2020-05-26 ENCOUNTER — Encounter: Payer: Self-pay | Admitting: *Deleted

## 2020-05-26 DIAGNOSIS — C259 Malignant neoplasm of pancreas, unspecified: Secondary | ICD-10-CM

## 2020-05-26 NOTE — Progress Notes (Signed)
[place] OFFICE VISIT PROGRESS NOTE  I connected with Jeremy Horton on 05/26/20 at 10:00 AM EDT by video and verified that I am speaking with the correct person using two identifiers.   I discussed the limitations, risks, security and privacy concerns of performing an evaluation and management service by telemedicine and the availability of in-person appointments. I also discussed with the patient that there may be a patient responsible charge related to this service. The patient expressed understanding and agreed to proceed.  Other persons participating in the visit and their role in the encounter: Wife  Patient's location: Home Provider's location: Office   Diagnosis: Pancreas cancer  INTERVAL HISTORY:   Jeremy Horton is seen today for a telehealth visit per his request.  His wife is present for today's visit.  He is enrolled in home hospice care.  His wife reports this is working well.  He continues to have pain in the right upper abdomen.  We adjusted the narcotic pain regimen yesterday.  The MS Contin was increased to 100 mg every 12 hours.  He reports improvement in pain today.  He is ambulatory in the home. He has constipation.  He is taking senna twice daily.  [name] reports his blood sugar has been measured in the 180-200 range.  He continues a home insulin sliding scale.   Lab Results:  Lab Results  Component Value Date   WBC 7.0 04/29/2020   HGB 10.6 (L) 04/29/2020   HCT 31.9 (L) 04/29/2020   MCV 86.4 04/29/2020   PLT 207 04/29/2020   NEUTROABS 5.2 04/28/2020     Medications: I have reviewed the patient's current medications.  Assessment/Plan: 1. Pancreas cancer diagnosed in May 2020 ? 8 cycles of FOLFIRINOX beginning 08/05/2018 ? MRI abdomen 11/03/2018-tumor progression with mass encasing celiac artery ? 11/23/2018-started gemcitabine/Abraxane ? 01/28/2019-CT with progressive disease, 5.6 and meter mass encasing  celiac artery, less than 180 SMA involvement, 1.6 cm superior peripancreatic node ? Completed 4 cycles of gem/Abraxane 02/10/2019 ? SBRT, 5 fractions (50 Gy) completed 04/17/2019 ? Gemcitabine/Abraxane continued with stable to slightly progressive disease ? 07/29/2019 CT-stable disease with mass encasing the celiac axis ? October 2021-CTs with progressive disease, treatment discontinued  2. Admission December 2021 with a bleeding duodenal ulcer treated with bipolar cautery 3. Anorexia/weight loss secondary to #1 4. Pain secondary to #1 5. Sacral decubitus ulcers 6. [employer]   Disposition: Jeremy Horton has pancreas cancer.  He developed progressive disease and has enrolled in home hospice care.  He has pain secondary to the locally advanced pancreas tumor.  He will continue the current narcotic regimen.  I suggested he begin MiraLAX for constipation.  His wife has been checking his blood sugar 4 times daily.  This will be decreased to twice daily.  He will continue the insulin sliding scale.  His wife will not administer nighttime insulin.  If the blood sugar remains less than 200 I recommend discontinuing CBG checks.  Jeremy Horton would like to schedule a follow-up visit at the Cancer center.  He will be scheduled at the [facility] office in 3 weeks.   I discussed the assessment and treatment plan with the patient. The patient was provided an opportunity to ask questions and all were answered. The patient agreed with the plan and demonstrated an understanding of the instructions.   The patient was advised to call back or seek an in-person evaluation if the symptoms worsen or if the condition  fails to improve as anticipated.  I provided 20 minutes of video, chart review, and documentation time during this encounter, and > 50% was spent counseling as documented under my assessment & plan.  Betsy Coder ANP/GNP-BC   05/26/2020 10:14 AM

## 2020-06-08 ENCOUNTER — Telehealth: Payer: Self-pay

## 2020-06-08 ENCOUNTER — Other Ambulatory Visit (HOSPITAL_COMMUNITY): Payer: Self-pay

## 2020-06-08 MED ORDER — METHADONE HCL 10 MG/ML PO CONC
ORAL | 0 refills | Status: DC
  Filled 2020-06-08: qty 30, 5d supply, fill #0

## 2020-06-08 NOTE — Telephone Encounter (Signed)
TC from [place] who stated she thinks Pt is transitioning  and will need something else for pain to replace the Ms Contin. Cristela Blue stated that Pt is self pay and recommended liquid morphine(Etensol) because Pt is losing his ability to swallow. Discussed this with Dr Benay Spice who agreed.

## 2020-06-13 ENCOUNTER — Other Ambulatory Visit (HOSPITAL_COMMUNITY): Payer: Self-pay

## 2020-06-13 MED ORDER — METHADONE HCL 10 MG/ML PO CONC
ORAL | 0 refills | Status: DC
  Filled 2020-06-13 – 2020-06-14 (×2): qty 30, 5d supply, fill #0

## 2020-06-14 ENCOUNTER — Other Ambulatory Visit (HOSPITAL_COMMUNITY): Payer: Self-pay

## 2020-06-15 ENCOUNTER — Other Ambulatory Visit (HOSPITAL_COMMUNITY): Payer: Self-pay

## 2020-06-15 MED ORDER — METHADONE HCL 10 MG/ML PO CONC
20.0000 mg | Freq: Three times a day (TID) | ORAL | 0 refills | Status: AC
  Filled 2020-06-15: qty 60, 10d supply, fill #0

## 2020-06-16 ENCOUNTER — Other Ambulatory Visit (HOSPITAL_COMMUNITY): Payer: Self-pay

## 2020-06-20 ENCOUNTER — Telehealth: Payer: Self-pay

## 2020-06-20 NOTE — Telephone Encounter (Signed)
TC from Nurse Royston Cowper from [facility] stating above Pt Jeremy Horton passed away 2020-07-16

## 2020-07-03 DEATH — deceased

## 2022-03-23 IMAGING — MR MR HEAD W/O CM
11 series · 48 of 48 positions shown · non-contrast
Comparison: CT same day

CLINICAL DATA: Mental status changes with confusion. Negative CT
evaluation.

EXAM:
MRI HEAD WITHOUT CONTRAST
TECHNIQUE: Multiplanar, multiecho pulse sequences of the brain and surrounding
structures were obtained without intravenous contrast.

[Series 5: DWI · axial · 3.0mm · 1.36mm/px · z∈[-25,+116]mm · 6 of 112 slices shown (1 of 4)]
[im 1/112]
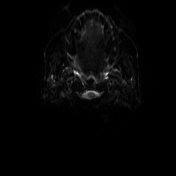
[im 23/112]
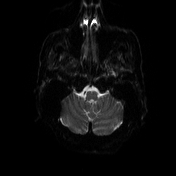
[im 45/112]
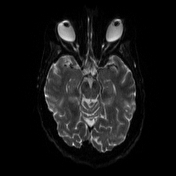
[im 67/112]
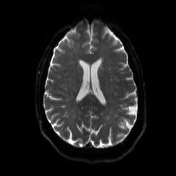
[im 89/112]
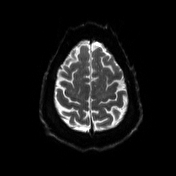
[im 112/112]
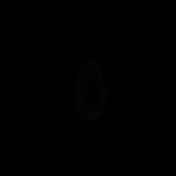

[Series 6: DWI · axial · 3.0mm · 1.36mm/px · z∈[-25,+116]mm · 2 of 52 slices shown (2 of 4)]
[im 1/52]
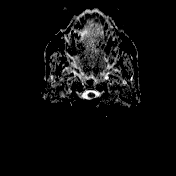
[im 52/52]
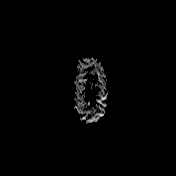

[Series 7: T1 · sagittal · 5.0mm · 0.75mm/px · 2 of 24 slices shown (1 of 2)]
[im 1/24]
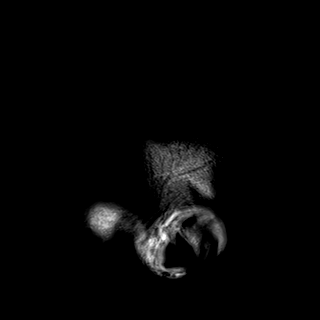
[im 24/24]
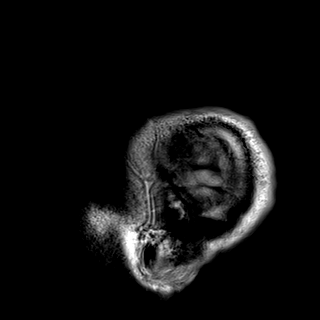

[Series 8: T2 · axial · 5.0mm · 0.62mm/px · z∈[-26,+124]mm · 2 of 28 slices shown (1 of 2)]
[im 1/28]
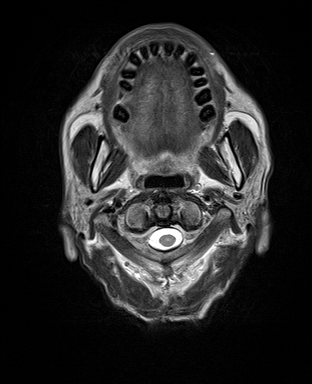
[im 28/28]
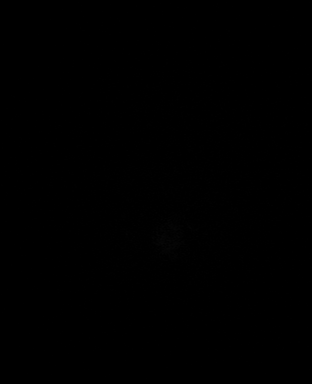

[Series 9: mip_images(sw) · axial · 24.0mm · 0.75mm/px · z∈[-18,+115]mm · 4 of 53 slices shown]
[im 1/53]
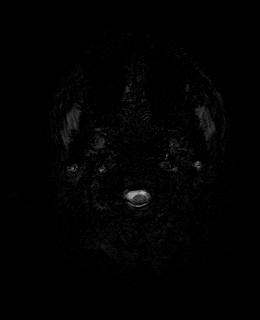
[im 18/53]
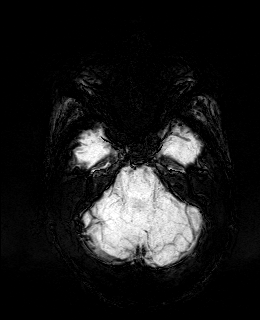
[im 35/53]
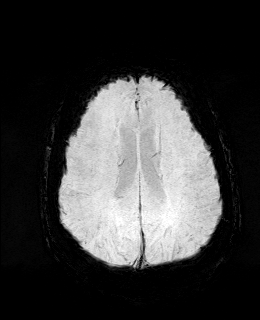
[im 53/53]
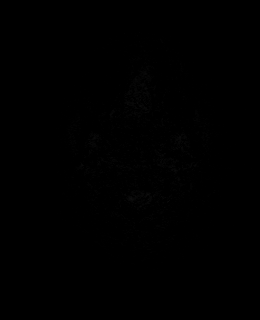

[Series 10: swi_images · axial · 3.0mm · 0.75mm/px · z∈[-27,+124]mm · 4 of 60 slices shown]
[im 1/60]
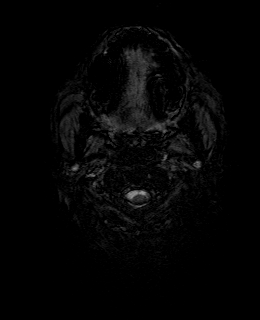
[im 20/60]
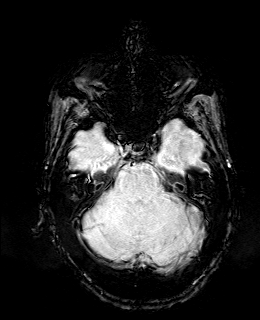
[im 40/60]
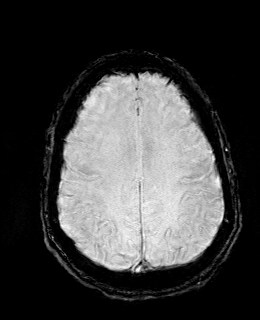
[im 60/60]
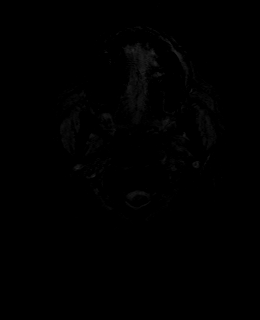

[Series 11: FLAIR · axial · 3.0mm · 0.75mm/px · z∈[-24,+114]mm · 4 of 55 slices shown]
[im 1/55]
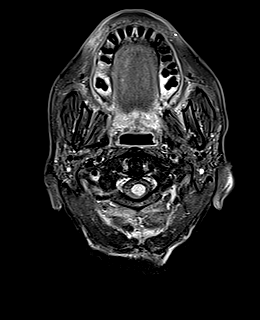
[im 19/55]
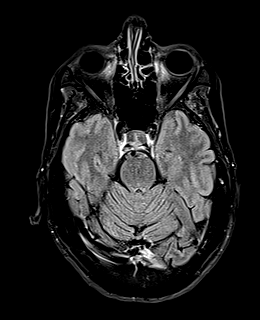
[im 37/55]
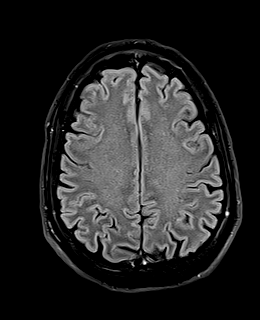
[im 55/55]
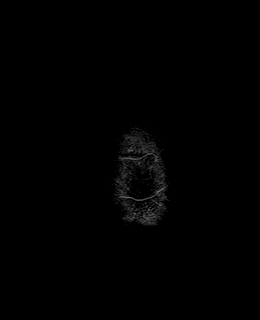

[Series 12: T1 · axial · 1.0mm · 0.94mm/px · z∈[-32,+117]mm · 12 of 176 slices shown (2 of 2)]
[im 1/176]
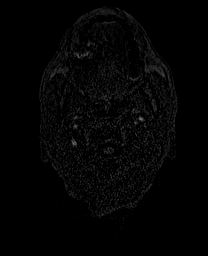
[im 16/176]
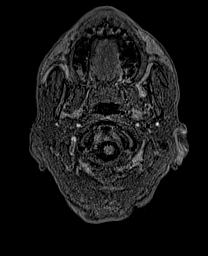
[im 32/176]
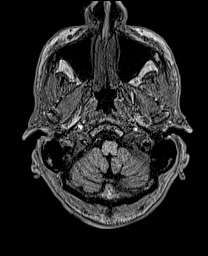
[im 48/176]
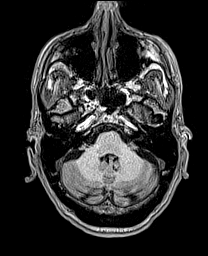
[im 64/176]
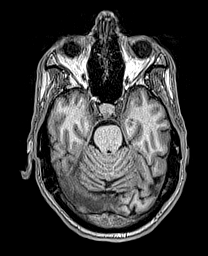
[im 80/176]
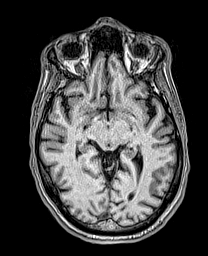
[im 96/176]
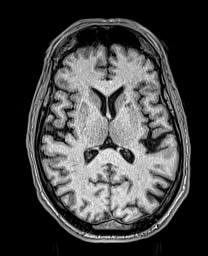
[im 112/176]
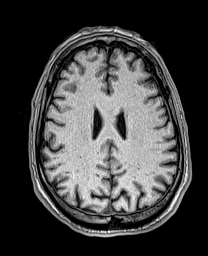
[im 128/176]
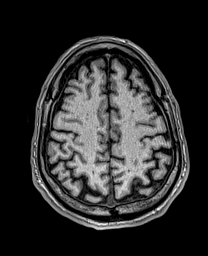
[im 144/176]
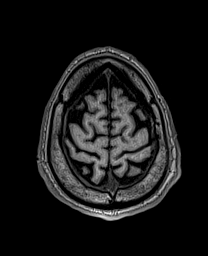
[im 160/176]
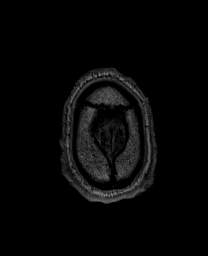
[im 176/176]
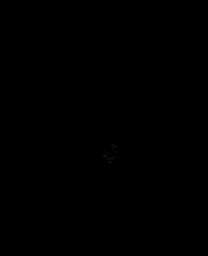

[Series 13: DWI · coronal · 5.0mm · 1.31mm/px · 6 of 84 slices shown (3 of 4)]
[im 1/84]
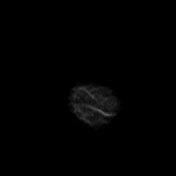
[im 17/84]
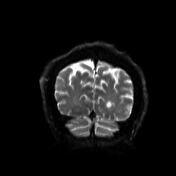
[im 34/84]
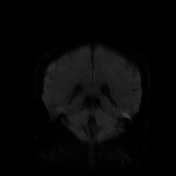
[im 50/84]
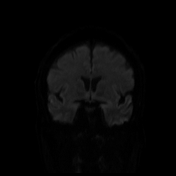
[im 67/84]
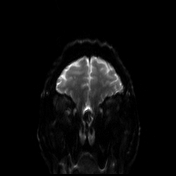
[im 84/84]
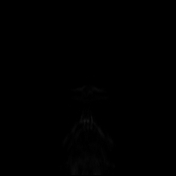

[Series 14: DWI · coronal · 5.0mm · 1.31mm/px · 3 of 42 slices shown (4 of 4)]
[im 1/42]
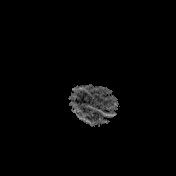
[im 21/42]
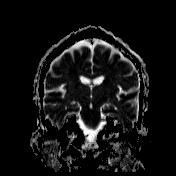
[im 42/42]
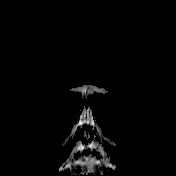

[Series 15: T2 · coronal · 5.0mm · 0.57mm/px · 3 of 40 slices shown (2 of 2)]
[im 1/40]
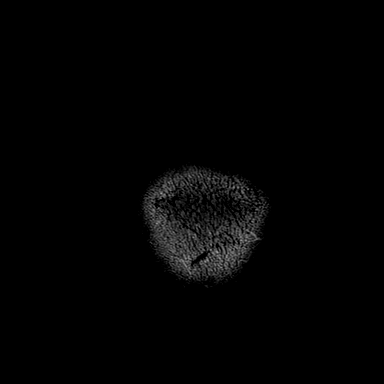
[im 20/40]
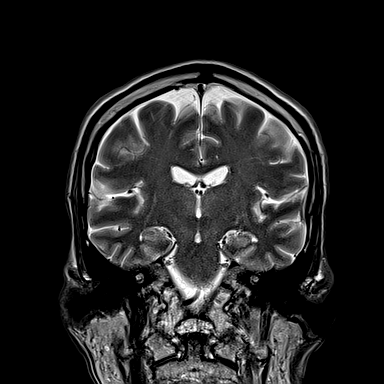
[im 40/40]
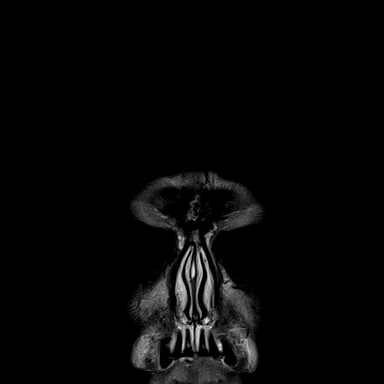

[48 of 48 positions shown; findings below may reference images not displayed]

FINDINGS: Brain: The brain has a normal appearance without evidence of
malformation, atrophy, old or acute small or large vessel
infarction, mass lesion, hemorrhage, hydrocephalus or extra-axial
collection.

Vascular: Major vessels at the base of the brain show flow. Venous
sinuses appear patent.

Skull and upper cervical spine: Normal.

Sinuses/Orbits: Clear/normal.

Other: None significant.
IMPRESSION: Normal head CT.

## 2022-03-23 IMAGING — DX DG CHEST 1V PORT
1 series · 1 of 1 positions shown · non-contrast
Comparison: None.

CLINICAL DATA: Altered mental status

EXAM:
PORTABLE CHEST 1 VIEW

[chest ap]
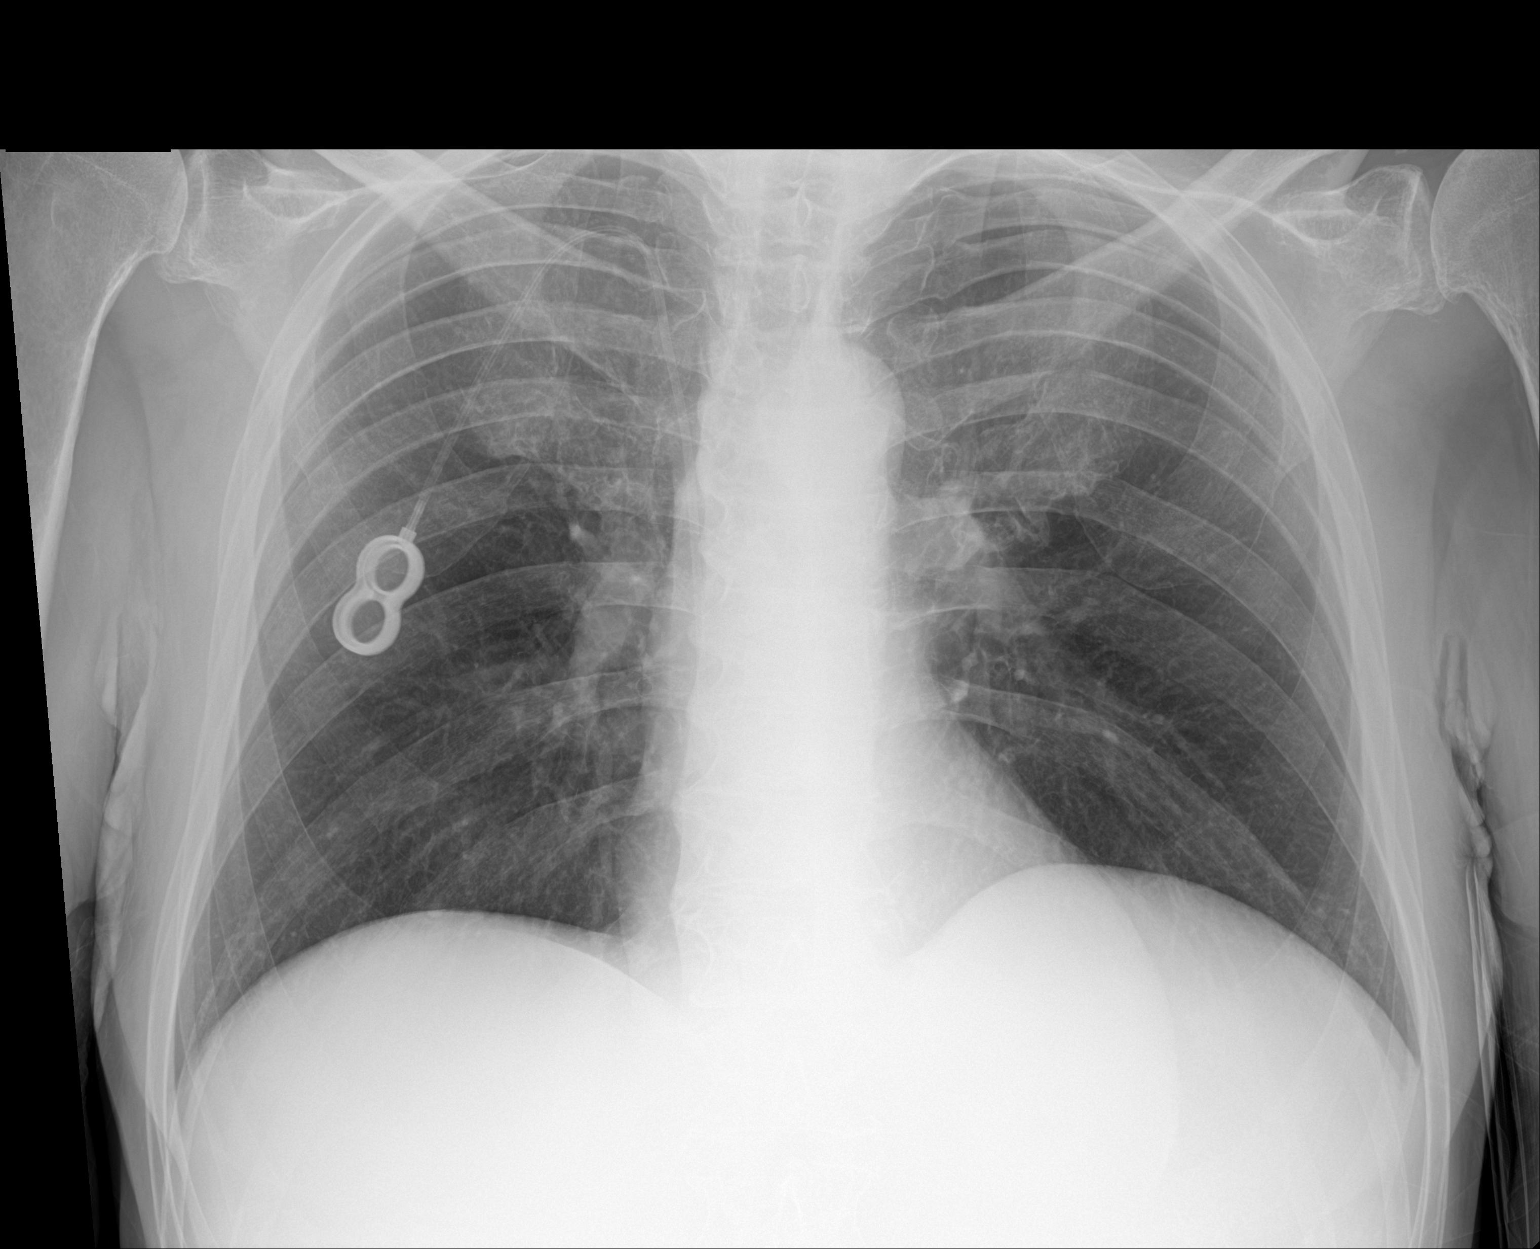

[1 of 1 positions shown; findings below may reference images not displayed]

FINDINGS: Right chest wall port catheter overlies SVC. Low lung volumes. No
consolidation or edema. No pleural effusion. Heart size is normal.
IMPRESSION: Low lung volumes.

## 2022-03-23 IMAGING — CT CT HEAD W/O CM
3 series · 15 of 47 positions shown, 18 images · non-contrast
Comparison: None.

CLINICAL DATA: Confusion/altered mental status for 2 days.

EXAM:
CT HEAD WITHOUT CONTRAST
TECHNIQUE: Contiguous axial images were obtained from the base of the skull
through the vertex without intravenous contrast.

[Series 2: head wo · axial · 0.47mm/px · z∈[-118,+32]mm · 9 of 36 slices shown, 12 images]
[im 3/36  brain]
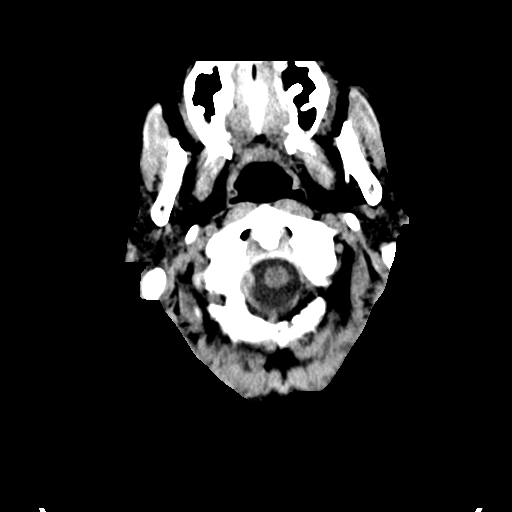
[im 3/36  bone]
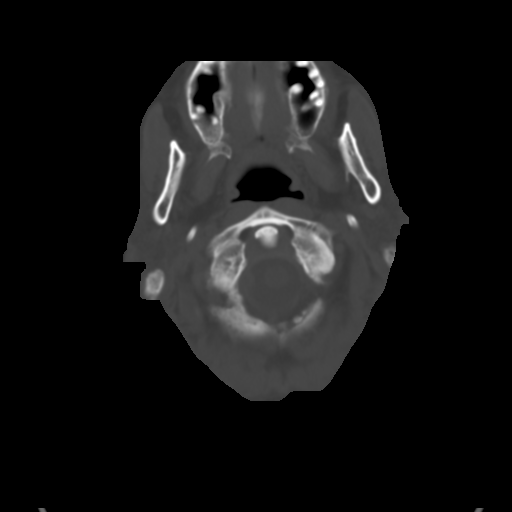
[im 7/36  brain]
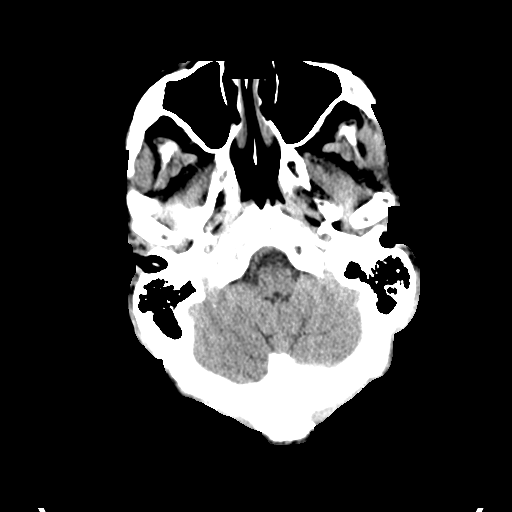
[im 10/36  brain]
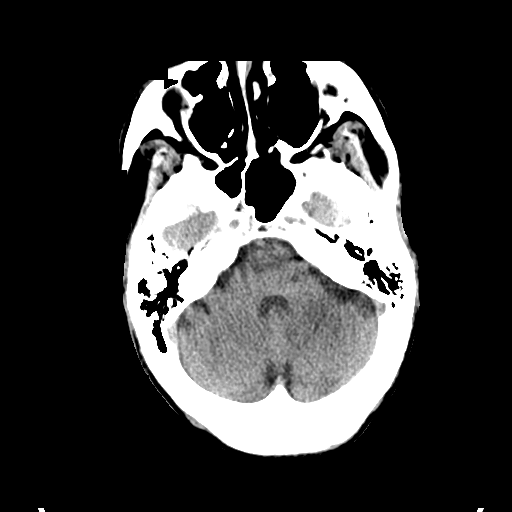
[im 14/36  brain]
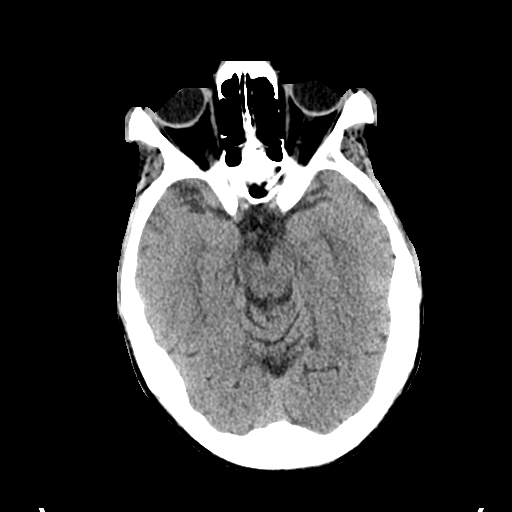
[im 19/36  brain]
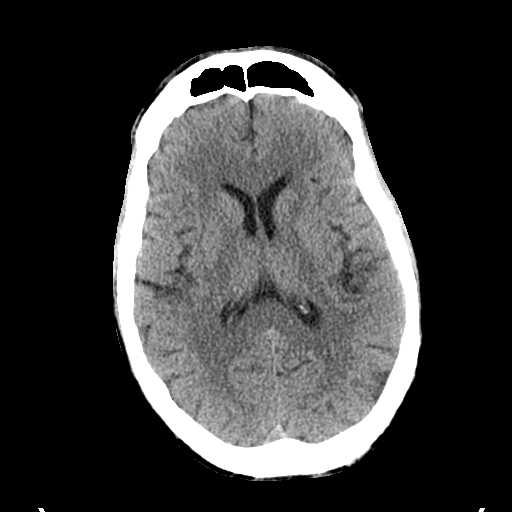
[im 19/36  bone]
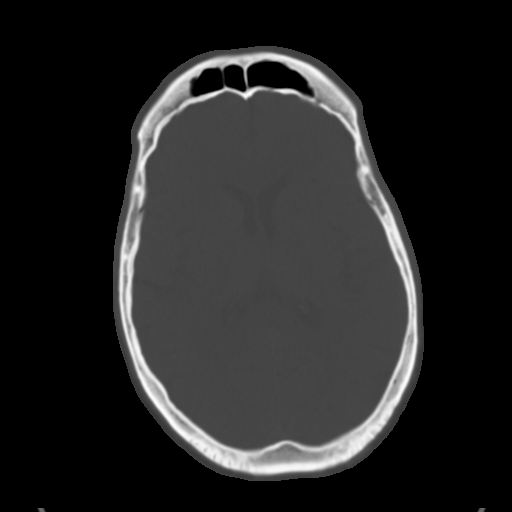
[im 22/36  brain]
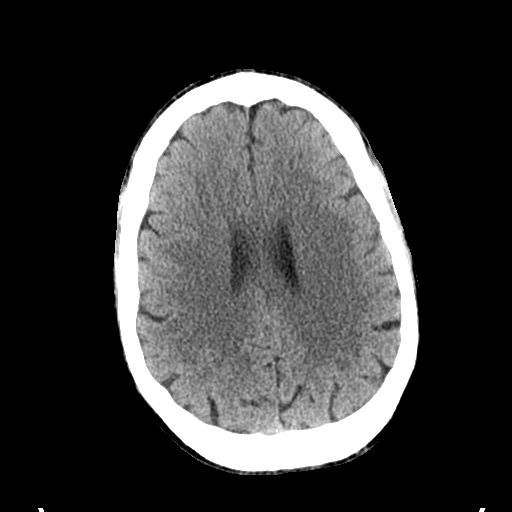
[im 26/36  brain]
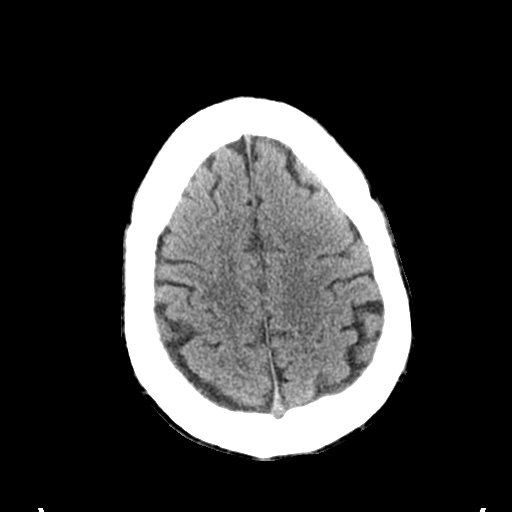
[im 29/36  brain]
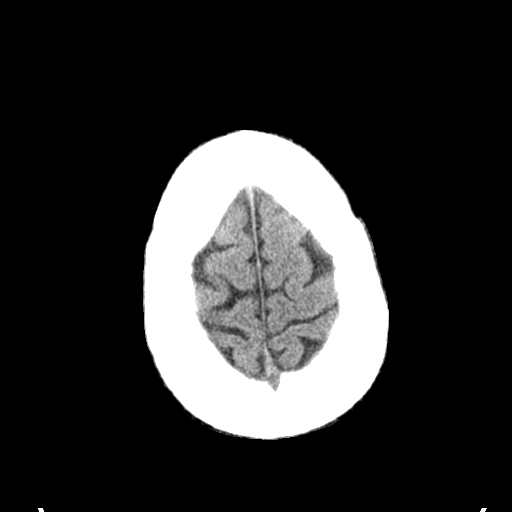
[im 33/36  brain]
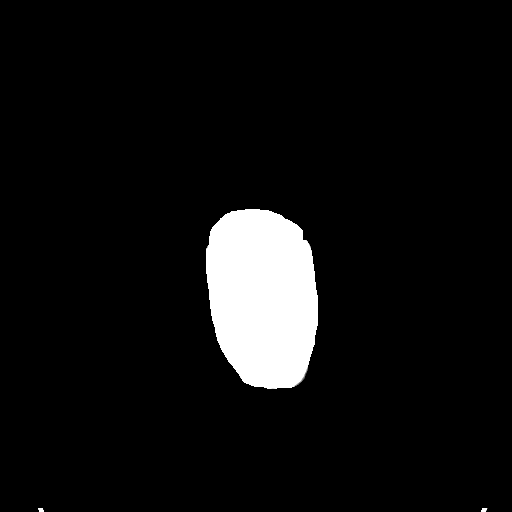
[im 33/36  bone]
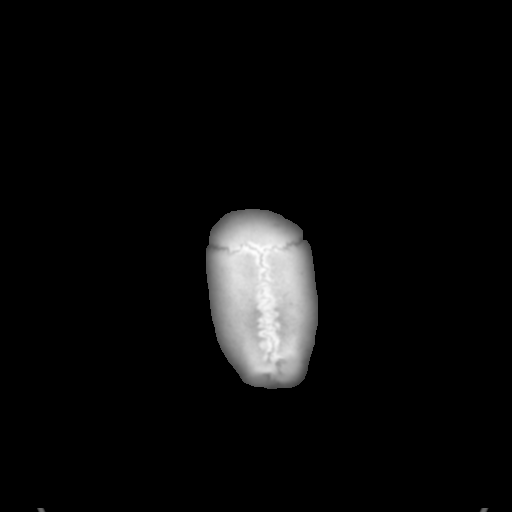

[Series 5: coronal soft tissue · coronal · 0.34mm/px · 3 of 75 slices shown]
[im 25/75  brain]
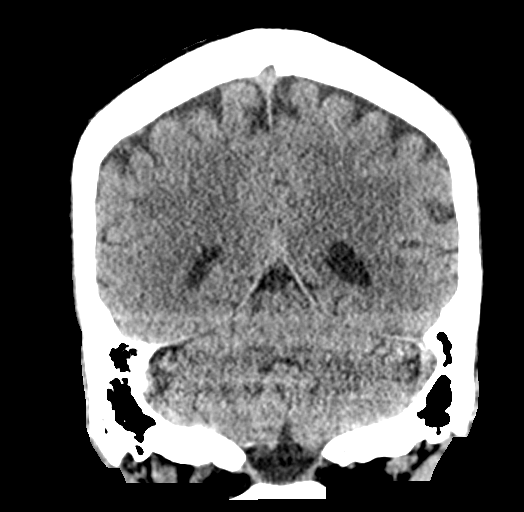
[im 33/75  brain]
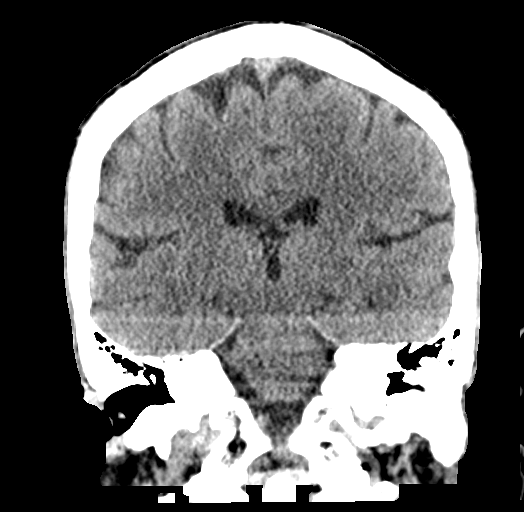
[im 42/75  brain]
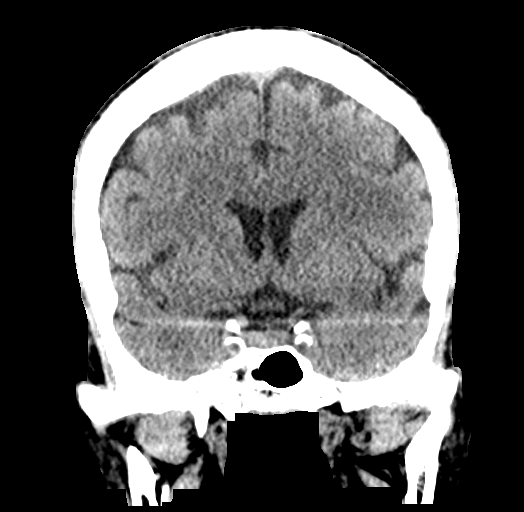

[Series 6: sagittal soft tissue · sagittal · 0.32mm/px · 3 of 61 slices shown]
[im 21/61  brain]
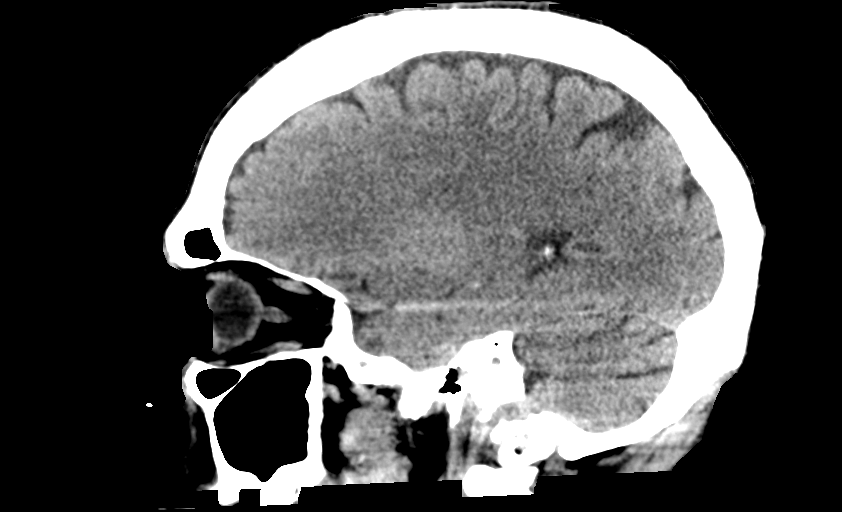
[im 31/61  brain]
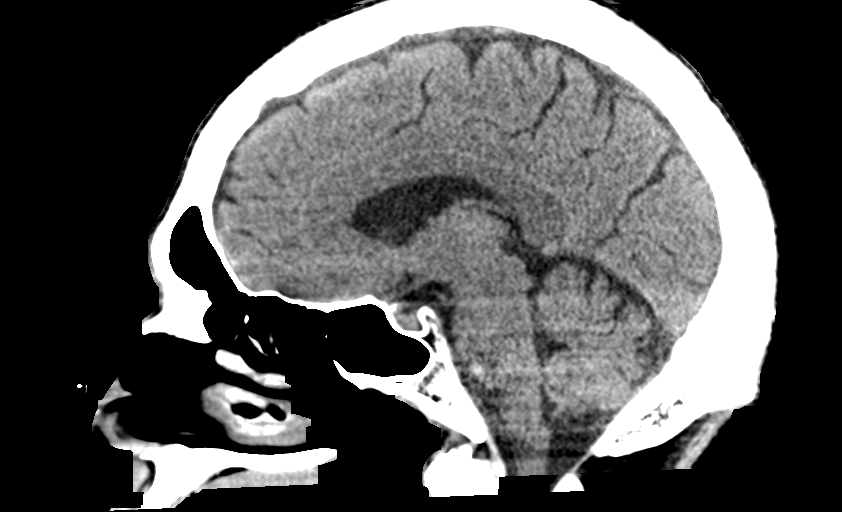
[im 41/61  brain]
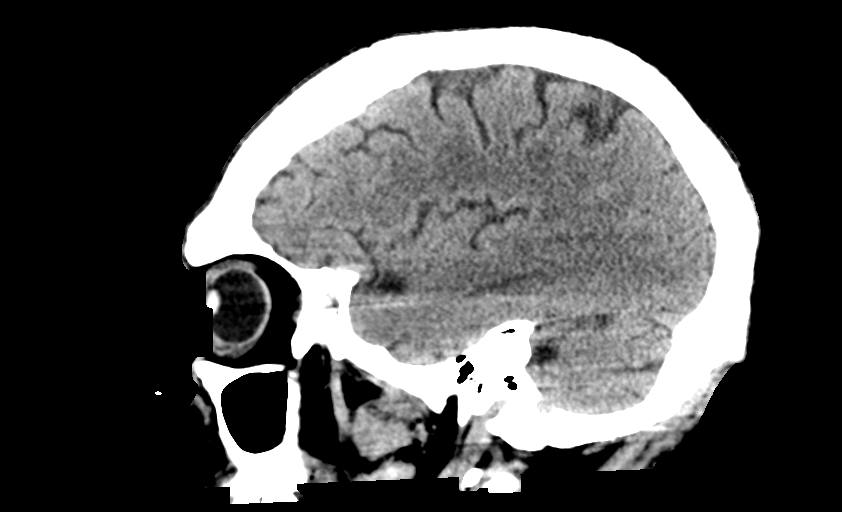

[15 of 47 positions shown; findings below may reference images not displayed]

FINDINGS: Brain: There is no evidence of an acute infarct, intracranial
hemorrhage, mass, midline shift, or extra-axial fluid collection.
The ventricles and sulci are normal.

Vascular: Calcified atherosclerosis at the skull base. No hyperdense
vessel.

Skull: No fracture or suspicious osseous lesion.

Sinuses/Orbits: Unremarkable orbits. Paranasal sinuses and mastoid
air cells are clear.

Other: None.
IMPRESSION: Unremarkable CT appearance of the brain.
